# Patient Record
Sex: Male | Born: 1980 | Race: White | Hispanic: No | Marital: Single | Smoking: Current every day smoker
Health system: Southern US, Community
[De-identification: ages and names within clinical notes are randomized; demographics above are authoritative.]

## PROBLEM LIST (undated history)

## (undated) DIAGNOSIS — F32A Depression, unspecified: Secondary | ICD-10-CM

## (undated) DIAGNOSIS — F329 Major depressive disorder, single episode, unspecified: Secondary | ICD-10-CM

---
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
## 2019-05-09 ENCOUNTER — Other Ambulatory Visit: Payer: Self-pay

## 2019-05-09 ENCOUNTER — Encounter: Payer: Self-pay | Admitting: Emergency Medicine

## 2019-05-09 ENCOUNTER — Emergency Department
Admission: EM | Admit: 2019-05-09 | Discharge: 2019-05-09 | Disposition: A | Discharge status: Home / Self Care | Payer: Self-pay | Attending: Emergency Medicine | Admitting: Emergency Medicine

## 2019-05-09 DIAGNOSIS — R079 Chest pain, unspecified: Secondary | ICD-10-CM | POA: Insufficient documentation

## 2019-05-09 DIAGNOSIS — Z5321 Procedure and treatment not carried out due to patient leaving prior to being seen by health care provider: Secondary | ICD-10-CM | POA: Insufficient documentation

## 2019-05-09 HISTORY — DX: Depression, unspecified: F32.A

## 2019-05-09 LAB — CBC
HCT: 42.1 % (ref 39.0–52.0)
Hemoglobin: 14.8 g/dL (ref 13.0–17.0)
MCH: 34.1 pg — ABNORMAL HIGH (ref 26.0–34.0)
MCHC: 35.2 g/dL (ref 30.0–36.0)
MCV: 97 fL (ref 80.0–100.0)
Platelets: 169 10*3/uL (ref 150–400)
RBC: 4.34 MIL/uL (ref 4.22–5.81)
RDW: 12.5 % (ref 11.5–15.5)
WBC: 6.3 10*3/uL (ref 4.0–10.5)
nRBC: 0 % (ref 0.0–0.2)

## 2019-05-09 LAB — BASIC METABOLIC PANEL
Anion gap: 9 (ref 5–15)
BUN: 10 mg/dL (ref 6–20)
CO2: 19 mmol/L — ABNORMAL LOW (ref 22–32)
Calcium: 8.6 mg/dL — ABNORMAL LOW (ref 8.9–10.3)
Chloride: 113 mmol/L — ABNORMAL HIGH (ref 98–111)
Creatinine, Ser: 0.83 mg/dL (ref 0.61–1.24)
GFR calc Af Amer: >60 mL/min (ref >60)
GFR calc non Af Amer: >60 mL/min (ref >60)
Glucose, Bld: 111 mg/dL — ABNORMAL HIGH (ref 70–99)
Potassium: 3.7 mmol/L (ref 3.5–5.1)
Sodium: 141 mmol/L (ref 135–145)

## 2019-05-09 LAB — ETHANOL: Alcohol, Ethyl (B): 253 mg/dL — ABNORMAL HIGH (ref <10)

## 2019-05-09 LAB — URINE DRUG SCREEN, QUALITATIVE (ARMC ONLY)
Amphetamines, Ur Screen: NOT DETECTED
Barbiturates, Ur Screen: NOT DETECTED
Benzodiazepine, Ur Scrn: NOT DETECTED
Cannabinoid 50 Ng, Ur ~~LOC~~: NOT DETECTED
Cocaine Metabolite,Ur ~~LOC~~: POSITIVE — AB
MDMA (Ecstasy)Ur Screen: NOT DETECTED
Methadone Scn, Ur: NOT DETECTED
Opiate, Ur Screen: NOT DETECTED
Phencyclidine (PCP) Ur S: NOT DETECTED
Tricyclic, Ur Screen: NOT DETECTED

## 2019-05-09 LAB — TROPONIN I: Troponin I: <0.03 ng/mL (ref <0.03)

## 2019-05-09 LAB — LIPASE, BLOOD: Lipase: 24 U/L (ref 11–51)

## 2019-05-09 NOTE — ED Notes (Signed)
Pt not in subwait, Danelle Earthly, rn states he removed pt's iv because pt states he wanted to leave.

## 2019-05-09 NOTE — ED Notes (Signed)
Per beth, ed tech, pt has left.

## 2019-05-09 NOTE — ED Notes (Addendum)
Pt requesting to leave and "go back to the [facility]", Charge (Lea) notified, IV removed as requested, pt verbally aggressive after being told the EDP had to assess the pt for requested "work note", [employer] notified d/t language and pt slapping walls as leaving: "fuck y'all, fuck all y'all"  Pt ambulatory to DC  April, first RN notified

## 2019-05-09 NOTE — ED Triage Notes (Signed)
Patient brought in by ems with complaint of central chest pain radiating to his back that started about 30 minutes ago. Patient states that he has some shortness of breath. Denies nausea and vomiting.

## 2019-05-09 NOTE — ED Notes (Signed)
This rn did not visualize pt leave department. Per officer in front pt left by side door, ambulatory from department.

## 2019-09-03 ENCOUNTER — Emergency Department
Admission: EM | Admit: 2019-09-03 | Discharge: 2019-09-03 | Disposition: A | Discharge status: Home / Self Care | Payer: Medicaid Other | Attending: Emergency Medicine | Admitting: Emergency Medicine

## 2019-09-03 ENCOUNTER — Other Ambulatory Visit: Payer: Self-pay

## 2019-09-03 DIAGNOSIS — F10188 Alcohol abuse with other alcohol-induced disorder: Secondary | ICD-10-CM | POA: Insufficient documentation

## 2019-09-03 DIAGNOSIS — F1019 Alcohol abuse with unspecified alcohol-induced disorder: Secondary | ICD-10-CM | POA: Diagnosis present

## 2019-09-03 MED ORDER — GABAPENTIN 300 MG PO CAPS: 300.0000 mg | ORAL_CAPSULE | Freq: Three times a day (TID) | ORAL | 0 refills | Status: DC

## 2019-09-03 MED ORDER — GABAPENTIN 300 MG PO CAPS
300.0000 mg | ORAL_CAPSULE | Freq: Three times a day (TID) | ORAL | Status: DC
  Administered 2019-09-03: 300 mg via ORAL
  Filled 2019-09-03: qty 1

## 2019-09-03 NOTE — BH Assessment (Signed)
Pt presents to ED requesting detox treatment. Pt provided with [facility] outpatient resources.

## 2019-09-03 NOTE — Discharge Instructions (Signed)
[facility] shelter Address: [address], [place], [place] [address] Hours:  Open ? Closes 11:59PM Phone: [phone]  [facility] & [facility]  Mental health service in [place], [place] Address: [address], [place],  [address] Hours:  Closed ? Caesar Bookman Phone: [phone]

## 2019-09-03 NOTE — ED Notes (Signed)
Patient is calm and cooperative, Patient denies Si/hi or avh at this time, will continue to monitor.

## 2019-09-03 NOTE — ED Provider Notes (Signed)
[facility] Emergency Department Provider Note  ____________________________________________   First MD Initiated Contact with Patient 09/03/19 1538     (approximate)  I have reviewed the triage vital signs and the nursing notes.   HISTORY  Chief Complaint Alcohol Problem    HPI Aaron Jensen is a 38 y.o. male with past medical history depression and chronic alcohol abuse here desiring help.  The patient was recently in a rehab.  He was 4 months clean.  He was kicked out of the rehab a week ago, and was staying at a hotel.  Since taking hotel, he endorses cocaine as well as alcohol use.  His last drink was approximately 50 minutes ago.  He states he no longer wants to drink and is here seeking help.  He requests inpatient detox.  Denies any actual suicidal or homicidal ideation.  He is not hallucinating.  Denies any history of complicated withdrawal.  He is amenable to outpatient follow-up if it is able to be arranged.        Past Medical History:  Diagnosis Date  . Depression     Patient Active Problem List   Diagnosis Date Noted  . Alcohol abuse with alcohol-induced disorder ([place]) 09/03/2019    No past surgical history on file.  Prior to Admission medications   Medication Sig Start Date End Date Taking? Authorizing Provider  gabapentin (NEURONTIN) 300 MG capsule Take 1 capsule (300 mg total) by mouth 3 (three) times daily. 09/03/19   Patrecia Pour, NP    Allergies Patient has no known allergies.  No family history on file.  Social History Social History   Tobacco Use  . Smoking status: Never Smoker  . Smokeless tobacco: Never Used  Substance Use Topics  . Alcohol use: Yes  . Drug use: Never    Review of Systems  Review of Systems  Constitutional: Negative for chills, fatigue and fever.  HENT: Negative for sore throat.   Respiratory: Negative for shortness of breath.   Cardiovascular: Negative for chest pain.  Gastrointestinal:  Negative for abdominal pain.  Genitourinary: Negative for flank pain.  Musculoskeletal: Negative for neck pain.  Skin: Negative for rash and wound.  Allergic/Immunologic: Negative for immunocompromised state.  Neurological: Negative for weakness and numbness.  Hematological: Does not bruise/bleed easily.  Psychiatric/Behavioral: Positive for behavioral problems.     ____________________________________________  PHYSICAL EXAM:      VITAL SIGNS: ED Triage Vitals  Enc Vitals Group     BP 09/03/19 1429 (!) 131/91     Pulse Rate 09/03/19 1429 (!) 111     Resp 09/03/19 1429 18     Temp 09/03/19 1429 98.6 F (37 C)     Temp Source 09/03/19 1429 Oral     SpO2 09/03/19 1429 95 %     Weight 09/03/19 1434 175 lb (79.4 kg)     Height --      Head Circumference --      Peak Flow --      Pain Score 09/03/19 1434 0     Pain Loc --      Pain Edu? --      Excl. in [place]? --      Physical Exam Vitals signs and nursing note reviewed.  Constitutional:      General: He is not in acute distress.    Appearance: He is well-developed.  HENT:     Head: Normocephalic and atraumatic.  Eyes:     Conjunctiva/sclera: Conjunctivae normal.  Neck:     Musculoskeletal: Neck supple.  Cardiovascular:     Rate and Rhythm: Normal rate and regular rhythm.     Heart sounds: Normal heart sounds. No murmur. No friction rub.  Pulmonary:     Effort: Pulmonary effort is normal. No respiratory distress.     Breath sounds: Normal breath sounds. No wheezing or rales.  Abdominal:     General: There is no distension.     Palpations: Abdomen is soft.     Tenderness: There is no abdominal tenderness.  Skin:    General: Skin is warm.     Capillary Refill: Capillary refill takes less than 2 seconds.  Neurological:     Mental Status: He is alert and oriented to person, place, and time.     Motor: No abnormal muscle tone.       ____________________________________________   LABS (all labs ordered are  listed, but only abnormal results are displayed)  Labs Reviewed - No data to display  ____________________________________________  EKG: None ________________________________________  RADIOLOGY All imaging, including plain films, CT scans, and ultrasounds, independently reviewed by me, and interpretations confirmed via formal radiology reads.  ED MD interpretation:   None  Official radiology report(s): No results found.  ____________________________________________  PROCEDURES   Procedure(s) performed (including Critical Care):  Procedures  ____________________________________________  INITIAL IMPRESSION / MDM / ASSESSMENT AND PLAN / ED COURSE  As part of my medical decision making, I reviewed the following data within the electronic MEDICAL RECORD NUMBER Notes from prior ED visits and [place] Controlled Substance Database      *Aaron Jensen was evaluated in Emergency Department on 09/03/2019 for the symptoms described in the history of present illness. He was evaluated in the context of the global COVID-19 pandemic, which necessitated consideration that the patient might be at risk for infection with the SARS-CoV-2 virus that causes COVID-19. Institutional protocols and algorithms that pertain to the evaluation of patients at risk for COVID-19 are in a state of rapid change based on information released by regulatory bodies including the CDC and federal and state organizations. These policies and algorithms were followed during the patient's care in the ED.  Some ED evaluations and interventions may be delayed as a result of limited staffing during the pandemic.*      Medical Decision Making: 38 year old male here with desire for detox.  Discussed that we are unable to provide inpatient detox, but he was provided with resources by TTS and psychiatry services.  They do not feel he needs urgent or emergent intervention or admission.  The patient admits to alcohol use but is clinically  sober and ambulatory without difficulty.  His tachycardia is resolved after he is able to talk, I suspect this was anxiety related.  He is not homicidal or suicidal.  Will discharge with close outpatient follow-up.  ____________________________________________  FINAL CLINICAL IMPRESSION(S) / ED DIAGNOSES  Final diagnoses:  Alcohol abuse with alcohol-induced disorder ([facility])     MEDICATIONS GIVEN DURING THIS VISIT:  Medications  gabapentin (NEURONTIN) capsule 300 mg (has no administration in time range)     ED Discharge Orders         Ordered    gabapentin (NEURONTIN) 300 MG capsule  3 times daily     09/03/19 1615    Increase activity slowly     09/03/19 1615    Diet - low sodium heart healthy     09/03/19 1615    Discharge instructions    Comments: Follow  up with [facility]   09/03/19 1615           Note:  This document was prepared using Dragon voice recognition software and may include unintentional dictation errors.   Shaune PollackIsaacs, Sinai Mahany, MD 09/03/19 [phone]
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
## 2019-09-03 NOTE — ED Notes (Signed)
Patient voiced understanding of discharge instructions, and all belongings with him, no signs of distress, he stated that He wanted to leave because He knows that He just messed up drinking and He was going to have to fix this problem and He knew the hospital could not help him with His addiction, Patient left and also states He was going to try to go back to the homeless shelter.

## 2019-09-03 NOTE — ED Triage Notes (Signed)
Pt presents via POV requesting detox from alcohol. Last drink x15 mins PTA. No active tremors. Denies seizure history.,

## 2019-12-05 ENCOUNTER — Other Ambulatory Visit: Payer: Self-pay

## 2019-12-05 ENCOUNTER — Emergency Department
Admission: EM | Admit: 2019-12-05 | Discharge: 2019-12-05 | Disposition: A | Discharge status: Home / Self Care | Payer: Self-pay | Attending: Emergency Medicine | Admitting: Emergency Medicine

## 2019-12-05 ENCOUNTER — Emergency Department: Payer: Self-pay

## 2019-12-05 ENCOUNTER — Encounter: Payer: Self-pay | Admitting: Emergency Medicine

## 2019-12-05 DIAGNOSIS — F17210 Nicotine dependence, cigarettes, uncomplicated: Secondary | ICD-10-CM | POA: Insufficient documentation

## 2019-12-05 DIAGNOSIS — J029 Acute pharyngitis, unspecified: Secondary | ICD-10-CM

## 2019-12-05 LAB — COMPREHENSIVE METABOLIC PANEL
ALT: 24 U/L (ref 0–44)
AST: 34 U/L (ref 15–41)
Albumin: 3.5 g/dL (ref 3.5–5.0)
Alkaline Phosphatase: 72 U/L (ref 38–126)
Anion gap: 9 (ref 5–15)
BUN: 9 mg/dL (ref 6–20)
CO2: 25 mmol/L (ref 22–32)
Calcium: 7.9 mg/dL — ABNORMAL LOW (ref 8.9–10.3)
Chloride: 104 mmol/L (ref 98–111)
Creatinine, Ser: 0.88 mg/dL (ref 0.61–1.24)
GFR calc Af Amer: >60 mL/min (ref >60)
GFR calc non Af Amer: >60 mL/min (ref >60)
Glucose, Bld: 100 mg/dL — ABNORMAL HIGH (ref 70–99)
Potassium: 3.4 mmol/L — ABNORMAL LOW (ref 3.5–5.1)
Sodium: 138 mmol/L (ref 135–145)
Total Bilirubin: 0.6 mg/dL (ref 0.3–1.2)
Total Protein: 6.4 g/dL — ABNORMAL LOW (ref 6.5–8.1)

## 2019-12-05 LAB — CBC WITH DIFFERENTIAL/PLATELET
Abs Immature Granulocytes: 0.02 10*3/uL (ref 0.00–0.07)
Basophils Absolute: 0.1 10*3/uL (ref 0.0–0.1)
Basophils Relative: 1 %
Eosinophils Absolute: 0.3 10*3/uL (ref 0.0–0.5)
Eosinophils Relative: 8 %
HCT: 41.9 % (ref 39.0–52.0)
Hemoglobin: 14.8 g/dL (ref 13.0–17.0)
Immature Granulocytes: 1 %
Lymphocytes Relative: 40 %
Lymphs Abs: 1.8 10*3/uL (ref 0.7–4.0)
MCH: 33.3 pg (ref 26.0–34.0)
MCHC: 35.3 g/dL (ref 30.0–36.0)
MCV: 94.2 fL (ref 80.0–100.0)
Monocytes Absolute: 0.4 10*3/uL (ref 0.1–1.0)
Monocytes Relative: 10 %
Neutro Abs: 1.8 10*3/uL (ref 1.7–7.7)
Neutrophils Relative %: 40 %
Platelets: 131 10*3/uL — ABNORMAL LOW (ref 150–400)
RBC: 4.45 MIL/uL (ref 4.22–5.81)
RDW: 12.4 % (ref 11.5–15.5)
WBC: 4.4 10*3/uL (ref 4.0–10.5)
nRBC: 0 % (ref 0.0–0.2)

## 2019-12-05 LAB — GROUP A STREP BY PCR: Group A Strep by PCR: NOT DETECTED

## 2019-12-05 NOTE — ED Provider Notes (Signed)
[facility] Emergency Department Provider Note   ____________________________________________   First MD Initiated Contact with Patient 12/05/19 [phone]     (approximate)  I have reviewed the triage vital signs and the nursing notes.   HISTORY  Chief Complaint Sore Throat  HPI Aaron Jensen is a 38 y.o. male presents to the ED this morning with complaint of sudden onset of sore throat.  Patient states that it woke him from his sleep and he is not sure whether he was sleeping with his mouth open.  He states it was difficult to swallow however he did drink water and iced tea before calling EMS.  Patient states that EMS reassured him that his breathing was okay and he came by private vehicle.  Patient initially went to work and was told to come to the ED for evaluation.  He denies any fever, chills, nausea or vomiting.  There is been no respiratory symptoms.  He denies any exposure to Covid however he does live in a boardinghouse.  He states that he mostly stays in his room but does have a kitchen that is shared by multiple people.  He rates his pain as 6 out of 10.       Past Medical History:  Diagnosis Date  . Depression     Patient Active Problem List   Diagnosis Date Noted  . 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 No pertinent surgical history.  Prior to Admission medications   Medication Sig Start Date End Date Taking? Authorizing Provider  gabapentin (NEURONTIN) 300 MG capsule Take 1 capsule (300 mg total) by mouth 3 (three) times daily. 09/03/19   Patrecia Pour, NP    Allergies Patient has no known allergies.  No family history on file.  Social History Social History   Tobacco Use  . Smoking status: Current Every Day Smoker  . Smokeless tobacco: Never Used  Substance Use Topics  . Alcohol use: Yes    Comment: occ  . Drug use: Never    Review of Systems Constitutional: No fever/chills Eyes:  No visual changes. ENT: Positive sore throat. Cardiovascular: Denies chest pain. Respiratory: Denies shortness of breath. Gastrointestinal: No abdominal pain.  No nausea, no vomiting.  No diarrhea. Musculoskeletal: Negative for back pain. Skin: Negative for rash. Neurological: Negative for headaches, focal weakness or numbness. ___________________________________________   PHYSICAL EXAM:  VITAL SIGNS: ED Triage Vitals  Enc Vitals Group     BP 12/05/19 0656 116/87     Pulse Rate 12/05/19 0656 82     Resp 12/05/19 0656 18     Temp 12/05/19 0656 (!) 97.5 F (36.4 C)     Temp Source 12/05/19 0656 Oral     SpO2 12/05/19 0656 98 %     Weight 12/05/19 0657 165 lb (74.8 kg)     Height 12/05/19 0657 6' (1.829 m)     Head Circumference --      Peak Flow --      Pain Score 12/05/19 0657 6     Pain Loc --      Pain Edu? --      Excl. in [place]? --     Constitutional: Alert and oriented. Well appearing and in no acute distress. Eyes: Conjunctivae are normal. PERRL. EOMI. Head: Atraumatic. Nose: No congestion/rhinnorhea. Mouth/Throat: Mucous membranes are moist.  Oropharynx mild erythema with uvula  midline and no tonsillar exudate is noted.  No tonsillar enlargement.  Posterior drainage is  also noted. Neck: No stridor.   Hematological/Lymphatic/Immunilogical: No cervical lymphadenopathy. Cardiovascular: Normal rate, regular rhythm. Grossly normal heart sounds.  Good peripheral circulation. Respiratory: Normal respiratory effort.  No retractions. Lungs CTAB. Musculoskeletal: No lower extremity tenderness nor edema.  No joint effusions. Neurologic:  Normal speech and language. No gross focal neurologic deficits are appreciated. No gait instability. Skin:  Skin is warm, dry and intact. No rash noted. Psychiatric: Mood and affect are normal. Speech and behavior are normal.  ____________________________________________   LABS (all labs ordered are listed, but only abnormal results are  displayed)  Labs Reviewed  CBC WITH DIFFERENTIAL/PLATELET - Abnormal; Notable for the following components:      Result Value   Platelets 131 (*)    All other components within normal limits  COMPREHENSIVE METABOLIC PANEL - Abnormal; Notable for the following components:   Potassium 3.4 (*)    Glucose, Bld 100 (*)    Calcium 7.9 (*)    Total Protein 6.4 (*)    All other components within normal limits  GROUP A STREP BY PCR    RADIOLOGY   Official radiology report(s): DG Chest Portable 1 View  Result Date: 12/05/2019 CLINICAL DATA:  Sore throat and shortness of breath starting today. EXAM: PORTABLE CHEST 1 VIEW COMPARISON:  None FINDINGS: Heart size within normal limits. There is no airspace consolidation within the lungs. No evidence of pleural effusion or pneumothorax. No acute bony abnormality. IMPRESSION: IMPRESSION No evidence of acute cardiopulmonary abnormality. Electronically Signed   By: Jackey Loge DO   On: 12/05/2019 08:26    ____________________________________________   PROCEDURES  Procedure(s) performed (including Critical Care):  Procedures   ____________________________________________   INITIAL IMPRESSION / ASSESSMENT AND PLAN / ED COURSE  As part of my medical decision making, I reviewed the following data within the electronic MEDICAL RECORD NUMBER Notes from prior ED visits and [place] Controlled Substance Database  [name] was evaluated in Emergency Department on 12/05/2019 for the symptoms described in the history of present illness. He was evaluated in the context of the global COVID-19 pandemic, which necessitated consideration that the patient might be at risk for infection with the SARS-CoV-2 virus that causes COVID-19. Institutional protocols and algorithms that pertain to the evaluation of patients at risk for COVID-19 are in a state of rapid change based on information released by regulatory bodies including the CDC and federal and state  organizations. These policies and algorithms were followed during the patient's care in the ED.  38 year old male presents to the ED with complaint of sore throat that woke him up during the night.  He states he feels that his tonsils are swollen and that he is not able to breathe.  He denies any fever or chills.  He lives in a boardinghouse and states that mostly he stays in his room but everyone shares the same kitchen.  It is possible that he has been exposed to Covid and does not know it.  Chest x-ray was negative for acute changes.  Lab work was reassuring.  Patient is only minimal hypokalemic and was encouraged to eat more foods containing potassium.  Strep test was negative.  Patient was given a note to remain out of work today.  He is also encouraged to go for a free Covid test through the health department should he develop more symptoms or become febrile.     ____________________________________________   FINAL CLINICAL IMPRESSION(S) / ED DIAGNOSES  Final diagnoses:  Acute pharyngitis, unspecified etiology  ED Discharge Orders    None       Note:  This document was prepared using Dragon voice recognition software and may include unintentional dictation errors.    Tommi RumpsSummers, Gardiner Espana L, PA-C 12/05/19 1105    Arnaldo NatalMalinda, Paul F, MD 12/05/19 [phone]

## 2019-12-05 NOTE — Discharge Instructions (Addendum)
Follow-up with Dr. Farrel Conners if any continued problems with your throat.  Strep test today was negative also your lab work was unremarkable and your chest x-ray was negative.  Decrease the amount of smoking that you are doing.  Also continue wearing a mask especially if you are in a boardinghouse situation where you may come in contact with someone who is positive for Covid.  If you develop fever and more symptoms she may want to consider getting a Covid test at the free clinic that is a [facility] here at the hospital.  Use salt water to gargle with for your throat pain.  Take Tylenol or ibuprofen as needed for throat pain.  Increase foods with potassium.  Follow-up with your primary care provider if any continued problems.

## 2019-12-05 NOTE — ED Triage Notes (Signed)
Patient states that he woke up with a sore throat and feels like his tonsils are swollen. Patient states that he drank some water and it started to feel better but then he felt like the swelling came back.

## 2019-12-05 NOTE — ED Notes (Signed)
Se triage note  Presents with sore throat this am  States feels like thorat is swollen  Afebrile on arrival

## 2020-04-13 ENCOUNTER — Encounter: Payer: Self-pay | Admitting: Emergency Medicine

## 2020-04-13 ENCOUNTER — Emergency Department
Admission: EM | Admit: 2020-04-13 | Discharge: 2020-04-13 | Disposition: A | Discharge status: Home / Self Care | Payer: Medicaid Other | Attending: Emergency Medicine | Admitting: Emergency Medicine

## 2020-04-13 ENCOUNTER — Other Ambulatory Visit: Payer: Self-pay

## 2020-04-13 DIAGNOSIS — F17210 Nicotine dependence, cigarettes, uncomplicated: Secondary | ICD-10-CM | POA: Insufficient documentation

## 2020-04-13 DIAGNOSIS — L235 Allergic contact dermatitis due to other chemical products: Secondary | ICD-10-CM | POA: Insufficient documentation

## 2020-04-13 DIAGNOSIS — L308 Other specified dermatitis: Secondary | ICD-10-CM | POA: Insufficient documentation

## 2020-04-13 MED ORDER — METHYLPREDNISOLONE 4 MG PO TBPK: ORAL_TABLET | ORAL | 0 refills | Status: DC

## 2020-04-13 MED ORDER — HYDROCORTISONE VALERATE 0.2 % EX OINT: TOPICAL_OINTMENT | CUTANEOUS | 1 refills | Status: DC

## 2020-04-13 MED ORDER — HYDROXYZINE HCL 50 MG PO TABS: 50.0000 mg | ORAL_TABLET | Freq: Three times a day (TID) | ORAL | 0 refills | Status: DC | PRN

## 2020-04-13 NOTE — Discharge Instructions (Signed)
Follow discharge care instruction and try to avoid irritants.  Take medication as directed.

## 2020-04-13 NOTE — ED Notes (Signed)
Pt denies fevers/chills. 

## 2020-04-13 NOTE — ED Triage Notes (Signed)
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 Pt with redness noted to both forearms and hands. Pt reports they itch and burn.

## 2020-04-13 NOTE — ED Provider Notes (Signed)
[facility] Emergency Department Provider Note   ____________________________________________   First MD Initiated Contact with Patient 04/13/20 1221     (approximate)  I have reviewed the triage vital signs and the nursing notes.   HISTORY  Chief Complaint Rash    HPI Aaron Jensen is a 39 y.o. male patient complaining of a rash with itching to the upper extremities.  Patient the rash starts at the hand spreads up the forearm.  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  Patient state he wears gloves but that did not come up fully on the forearms. . Patient states gloves become full of water when he emerges deeply into the sinks.  Patient stays in the process of obtaining clothes of a longer length.  Patient rates his pain discomfort as 8/10.  Patient describes his pain/discomfort as "itching".  No palliative measure for complaint.      Past Medical History:  Diagnosis Date  . Depression     Patient Active Problem List   Diagnosis Date Noted  . Alcohol abuse with alcohol-induced disorder (HCC) 09/03/2019    History reviewed. No pertinent surgical history.  Prior to Admission medications   Medication Sig Start Date End Date Taking? Authorizing Provider  gabapentin (NEURONTIN) 300 MG capsule Take 1 capsule (300 mg total) by mouth 3 (three) times daily. 09/03/19   Charm Rings, NP  hydrocortisone valerate ointment (WESTCORT) 0.2 % Apply to affected area daily 04/13/20 04/13/21  Joni Reining, PA-C  hydrOXYzine (ATARAX/VISTARIL) 50 MG tablet Take 1 tablet (50 mg total) by mouth 3 (three) times daily as needed for itching. 04/13/20   Joni Reining, PA-C  methylPREDNISolone (MEDROL DOSEPAK) 4 MG TBPK tablet Take Tapered dose as directed 04/13/20   Joni Reining, PA-C    Allergies Patient has no known allergies.  No family history on file.  Social History Social History   Tobacco Use  . Smoking status: Current  Every Day Smoker  . Smokeless tobacco: Never Used  Substance Use Topics  . Alcohol use: Yes    Comment: occ  . Drug use: Never    Review of Systems  Constitutional: No fever/chills Eyes: No visual changes. ENT: No sore throat. Cardiovascular: Denies chest pain. Respiratory: Denies shortness of breath. Gastrointestinal: No abdominal pain.  No nausea, no vomiting.  No diarrhea.  No constipation. Genitourinary: Negative for dysuria. Musculoskeletal: Negative for back pain. Skin: Negative for rash. Neurological: Negative for headaches, focal weakness or numbness. Psychiatric:  Depression ____________________________________________   PHYSICAL EXAM:  VITAL SIGNS: ED Triage Vitals  Enc Vitals Group     BP 04/13/20 1146 (!) 127/92     Pulse Rate 04/13/20 1146 91     Resp --      Temp 04/13/20 1146 97.9 F (36.6 C)     Temp Source 04/13/20 1146 Oral     SpO2 04/13/20 1146 99 %     Weight 04/13/20 1142 165 lb (74.8 kg)     Height 04/13/20 1142 6' (1.829 m)     Head Circumference --      Peak Flow --      Pain Score 04/13/20 1142 8     Pain Loc --      Pain Edu? --      Excl. in GC? --    Constitutional: Alert and oriented. Well appearing and in no acute distress. Cardiovascular: Normal rate, regular rhythm. Grossly normal heart sounds.  Good peripheral circulation.  Respiratory: Normal respiratory effort.  No retractions. Lungs CTAB. Skin:  Skin is warm, dry and intact.  Vesicular lesions on the hand.  Signs of excoriation bilateral forearms. Psychiatric: Mood and affect are normal. Speech and behavior are normal.  ____________________________________________   LABS (all labs ordered are listed, but only abnormal results are displayed)  Labs Reviewed - No data to display ____________________________________________  EKG   ____________________________________________  RADIOLOGY  ED MD interpretation:    Official radiology report(s): No results  found.  ____________________________________________   PROCEDURES  Procedure(s) performed (including Critical Care):  Procedures   ____________________________________________   INITIAL IMPRESSION / ASSESSMENT AND PLAN / ED COURSE  As part of my medical decision making, I reviewed the following data within the electronic MEDICAL RECORD NUMBER     Contact dermatitis and eczema to bilateral upper extremities.  Patient given discharge care instructions.  Patient advised to obtain full-length gloves covering the upper extremities.  Take medication as directed.  Follow-up with dermatology if no improvement in 2 weeks.    [name] was evaluated in Emergency Department on 04/13/2020 for the symptoms described in the history of present illness. He was evaluated in the context of the global COVID-19 pandemic, which necessitated consideration that the patient might be at risk for infection with the SARS-CoV-2 virus that causes COVID-19. Institutional protocols and algorithms that pertain to the evaluation of patients at risk for COVID-19 are in a state of rapid change based on information released by regulatory bodies including the CDC and federal and state organizations. These policies and algorithms were followed during the patient's care in the ED.       ____________________________________________   FINAL CLINICAL IMPRESSION(S) / ED DIAGNOSES  Final diagnoses:  Allergic dermatitis due to other chemical product  Other eczema     ED Discharge Orders         Ordered    hydrocortisone valerate ointment (WESTCORT) 0.2 %     04/13/20 1228    methylPREDNISolone (MEDROL DOSEPAK) 4 MG TBPK tablet     04/13/20 1228    hydrOXYzine (ATARAX/VISTARIL) 50 MG tablet  3 times daily PRN     04/13/20 1228           Note:  This document was prepared using Dragon voice recognition software and may include unintentional dictation errors.    Sable Feil, PA-C 04/13/20 1235     Blake Divine, MD 04/14/20 0800

## 2020-08-23 ENCOUNTER — Emergency Department
Admission: EM | Admit: 2020-08-23 | Discharge: 2020-08-23 | Disposition: A | Discharge status: Home / Self Care | Payer: Medicaid Other | Attending: Emergency Medicine | Admitting: Emergency Medicine

## 2020-08-23 ENCOUNTER — Other Ambulatory Visit: Payer: Self-pay

## 2020-08-23 ENCOUNTER — Encounter: Payer: Self-pay | Admitting: Emergency Medicine

## 2020-08-23 DIAGNOSIS — Y939 Activity, unspecified: Secondary | ICD-10-CM | POA: Insufficient documentation

## 2020-08-23 DIAGNOSIS — Y929 Unspecified place or not applicable: Secondary | ICD-10-CM | POA: Insufficient documentation

## 2020-08-23 DIAGNOSIS — Y999 Unspecified external cause status: Secondary | ICD-10-CM | POA: Insufficient documentation

## 2020-08-23 DIAGNOSIS — X19XXXA Contact with other heat and hot substances, initial encounter: Secondary | ICD-10-CM | POA: Insufficient documentation

## 2020-08-23 DIAGNOSIS — T300 Burn of unspecified body region, unspecified degree: Secondary | ICD-10-CM

## 2020-08-23 DIAGNOSIS — T24332A Burn of third degree of left lower leg, initial encounter: Secondary | ICD-10-CM | POA: Insufficient documentation

## 2020-08-23 DIAGNOSIS — F17200 Nicotine dependence, unspecified, uncomplicated: Secondary | ICD-10-CM | POA: Insufficient documentation
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
## 2020-09-26 ENCOUNTER — Encounter: Payer: Self-pay | Admitting: Emergency Medicine

## 2020-09-26 ENCOUNTER — Emergency Department: Payer: Medicaid Other

## 2020-09-26 ENCOUNTER — Other Ambulatory Visit: Payer: Self-pay

## 2020-09-26 ENCOUNTER — Emergency Department
Admission: EM | Admit: 2020-09-26 | Discharge: 2020-09-26 | Disposition: A | Discharge status: Home / Self Care | Payer: Medicaid Other | Attending: Emergency Medicine | Admitting: Emergency Medicine

## 2020-09-26 DIAGNOSIS — F1021 Alcohol dependence, in remission: Secondary | ICD-10-CM | POA: Insufficient documentation

## 2020-09-26 DIAGNOSIS — F17200 Nicotine dependence, unspecified, uncomplicated: Secondary | ICD-10-CM | POA: Insufficient documentation

## 2020-09-26 DIAGNOSIS — G454 Transient global amnesia: Secondary | ICD-10-CM | POA: Insufficient documentation

## 2020-09-26 DIAGNOSIS — R413 Other amnesia: Secondary | ICD-10-CM

## 2020-09-26 LAB — URINALYSIS, COMPLETE (UACMP) WITH MICROSCOPIC
Bacteria, UA: NONE SEEN
Bilirubin Urine: NEGATIVE
Glucose, UA: NEGATIVE mg/dL
Hgb urine dipstick: NEGATIVE
Ketones, ur: NEGATIVE mg/dL
Leukocytes,Ua: NEGATIVE
Nitrite: NEGATIVE
Protein, ur: NEGATIVE mg/dL
Specific Gravity, Urine: 1.017 (ref 1.005–1.030)
Squamous Epithelial / HPF: NONE SEEN (ref 0–5)
pH: 5 (ref 5.0–8.0)

## 2020-09-26 LAB — CBC
HCT: 43.1 % (ref 39.0–52.0)
Hemoglobin: 14.9 g/dL (ref 13.0–17.0)
MCH: 33.4 pg (ref 26.0–34.0)
MCHC: 34.6 g/dL (ref 30.0–36.0)
MCV: 96.6 fL (ref 80.0–100.0)
Platelets: 160 10*3/uL (ref 150–400)
RBC: 4.46 MIL/uL (ref 4.22–5.81)
RDW: 12.3 % (ref 11.5–15.5)
WBC: 7.3 10*3/uL (ref 4.0–10.5)
nRBC: 0 % (ref 0.0–0.2)

## 2020-09-26 LAB — BASIC METABOLIC PANEL
Anion gap: 8 (ref 5–15)
BUN: 19 mg/dL (ref 6–20)
CO2: 25 mmol/L (ref 22–32)
Calcium: 8.9 mg/dL (ref 8.9–10.3)
Chloride: 103 mmol/L (ref 98–111)
Creatinine, Ser: 1.12 mg/dL (ref 0.61–1.24)
GFR calc non Af Amer: >60 mL/min (ref >60)
Glucose, Bld: 94 mg/dL (ref 70–99)
Potassium: 3.4 mmol/L — ABNORMAL LOW (ref 3.5–5.1)
Sodium: 136 mmol/L (ref 135–145)

## 2020-09-26 MED ORDER — THIAMINE HCL 100 MG PO TABS: 100.0000 mg | ORAL_TABLET | Freq: Every day | ORAL | 0 refills | Status: DC

## 2020-09-26 NOTE — ED Provider Notes (Signed)
[facility] Emergency Department Provider Note  ____________________________________________   First MD Initiated Contact with Patient 09/26/20 1329     (approximate)  I have reviewed the triage vital signs and the nursing notes.   HISTORY  Chief Complaint Loss of Consciousness    HPI Aaron Jensen is a 39 y.o. male surgical history depression, chronic alcohol abuse, here with episodes in which he "blacked out."  He states that over the last several weeks to months, he has noticed that he has been having episodes in which he feels like he loses track of certain periods of time.  He states that while at work, he has had multiple episodes where he has "blacked out" for 10 to 15 minutes at a time.  He is not actually unconscious during these episodes and has been told that he continues to work, discussed with coworkers, and has had no witnessed seizure-like activity.  However, he feels like he does not remember what happened during these episodes, and he has behaved oddly, for example sitting in a puddle at work without meaning to.  Denies any preceding or subsequent headaches, vision changes, numbness, or weakness.  No history of seizures.  His last alcohol use was 6 to 9 months ago, and he denies any history of seizures during his detox.  No other acute complaints.  No fevers or chills.  No recent medication changes.  No other drug use.        Past Medical History:  Diagnosis Date  . Depression     Patient Active Problem List   Diagnosis Date Noted  . Alcohol abuse with alcohol-induced disorder (HCC) 09/03/2019    History reviewed. No pertinent surgical history.  Prior to Admission medications   Medication Sig Start Date End Date Taking? Authorizing Provider  gabapentin (NEURONTIN) 300 MG capsule Take 1 capsule (300 mg total) by mouth 3 (three) times daily. 09/03/19   Charm Rings, NP  hydrocortisone valerate ointment (WESTCORT) 0.2 % Apply to affected  area daily 04/13/20 04/13/21  Joni Reining, PA-C  hydrOXYzine (ATARAX/VISTARIL) 50 MG tablet Take 1 tablet (50 mg total) by mouth 3 (three) times daily as needed for itching. 04/13/20   Joni Reining, PA-C  methylPREDNISolone (MEDROL DOSEPAK) 4 MG TBPK tablet Take Tapered dose as directed 04/13/20   Joni Reining, PA-C  thiamine 100 MG tablet Take 1 tablet (100 mg total) by mouth daily. 09/26/20 10/26/20  Shaune Pollack, MD    Allergies Patient has no known allergies.  No family history on file.  Social History Social History   Tobacco Use  . Smoking status: Current Every Day Smoker  . Smokeless tobacco: Never Used  Substance Use Topics  . Alcohol use: Not Currently    Comment: last drink 7-8 months ago  . Drug use: Never    Review of Systems  Review of Systems  Constitutional: Negative for chills, fatigue and fever.  HENT: Negative for sore throat.   Respiratory: Negative for shortness of breath.   Cardiovascular: Negative for chest pain.  Gastrointestinal: Negative for abdominal pain.  Genitourinary: Negative for flank pain.  Musculoskeletal: Negative for neck pain.  Skin: Negative for rash and wound.  Allergic/Immunologic: Negative for immunocompromised state.  Neurological: Negative for weakness and numbness.  Hematological: Does not bruise/bleed easily.  Psychiatric/Behavioral: Positive for confusion.     ____________________________________________  PHYSICAL EXAM:      VITAL SIGNS: ED Triage Vitals  Enc Vitals Group     BP  09/26/20 1015 128/85     Pulse Rate 09/26/20 1015 91     Resp 09/26/20 1015 18     Temp 09/26/20 1015 98.3 F (36.8 C)     Temp Source 09/26/20 1015 Oral     SpO2 09/26/20 1015 100 %     Weight 09/26/20 1016 165 lb (74.8 kg)     Height 09/26/20 1016 6' (1.829 m)     Head Circumference --      Peak Flow --      Pain Score 09/26/20 1029 0     Pain Loc --      Pain Edu? --      Excl. in GC? --      Physical Exam Vitals and  nursing note reviewed.  Constitutional:      General: He is not in acute distress.    Appearance: He is well-developed.  HENT:     Head: Normocephalic and atraumatic.  Eyes:     Conjunctiva/sclera: Conjunctivae normal.  Cardiovascular:     Rate and Rhythm: Normal rate and regular rhythm.     Heart sounds: Normal heart sounds. No murmur heard.  No friction rub.  Pulmonary:     Effort: Pulmonary effort is normal. No respiratory distress.     Breath sounds: Normal breath sounds. No wheezing or rales.  Abdominal:     General: There is no distension.     Palpations: Abdomen is soft.     Tenderness: There is no abdominal tenderness.  Musculoskeletal:     Cervical back: Neck supple.  Skin:    General: Skin is warm.     Capillary Refill: Capillary refill takes less than 2 seconds.  Neurological:     Mental Status: He is alert and oriented to person, place, and time.     Motor: No abnormal muscle tone.      Neurological Exam:  Mental Status: Alert and oriented to person, place, and time. Attention and concentration normal. Speech clear. Recent memory is intact. Cranial Nerves: Visual fields grossly intact. EOMI and PERRLA. No nystagmus noted. Facial sensation intact at forehead, maxillary cheek, and chin/mandible bilaterally. No facial asymmetry or weakness. Hearing grossly normal. Uvula is midline, and palate elevates symmetrically. Normal SCM and trapezius strength. Tongue midline without fasciculations. Motor: Muscle strength 5/5 in proximal and distal UE and LE bilaterally. No pronator drift. Muscle tone normal.   Sensation: Intact to light touch in upper and lower extremities distally bilaterally.  Gait: Normal without ataxia. Coordination: Normal FTN bilaterally.    ____________________________________________   LABS (all labs ordered are listed, but only abnormal results are displayed)  Labs Reviewed  BASIC METABOLIC PANEL - Abnormal; Notable for the following components:       Result Value   Potassium 3.4 (*)    All other components within normal limits  URINALYSIS, COMPLETE (UACMP) WITH MICROSCOPIC - Abnormal; Notable for the following components:   Color, Urine YELLOW (*)    APPearance CLEAR (*)    All other components within normal limits  CBC  CBG MONITORING, ED    ____________________________________________  EKG: Normal sinus rhythm, rate 94.  PR 114, QRS 88, QTc 420.  No acute ST elevations or depressions.  No EKG evidence of acute arrhythmia, ischemia, or infarct. ________________________________________  RADIOLOGY All imaging, including plain films, CT scans, and ultrasounds, independently reviewed by me, and interpretations confirmed via formal radiology reads.  ED MD interpretation:   CT head: Negative, no acute abnormality  Official radiology report(s):  CT HEAD WO CONTRAST  Result Date: 09/26/2020 CLINICAL DATA:  Syncope. EXAM: CT HEAD WITHOUT CONTRAST TECHNIQUE: Contiguous axial images were obtained from the base of the skull through the vertex without intravenous contrast. COMPARISON:  02/09/2019 FINDINGS: Brain: There is no evidence for acute hemorrhage, hydrocephalus, mass lesion, or abnormal extra-axial fluid collection. No definite CT evidence for acute infarction. Vascular: No hyperdense vessel or unexpected calcification. Skull: No evidence for fracture. No worrisome lytic or sclerotic lesion. Sinuses/Orbits: Polypoid mucosal disease in the right sphenoid sinus is stable. Visualized portions of the globes and intraorbital fat are unremarkable. Other: None. IMPRESSION: Stable.  Unremarkable CT appearance of the brain. Electronically Signed   By: Kennith Center M.D.   On: 09/26/2020 11:25    ____________________________________________  PROCEDURES   Procedure(s) performed (including Critical Care):  Procedures  ____________________________________________  INITIAL IMPRESSION / MDM / ASSESSMENT AND PLAN / ED COURSE  As part of  my medical decision making, I reviewed the following data within the electronic MEDICAL RECORD NUMBER Nursing notes reviewed and incorporated, Old chart reviewed, Notes from prior ED visits, and [place] Controlled Substance Database       *Fordyce Lepak was evaluated in Emergency Department on 09/26/2020 for the symptoms described in the history of present illness. He was evaluated in the context of the global COVID-19 pandemic, which necessitated consideration that the patient might be at risk for infection with the SARS-CoV-2 virus that causes COVID-19. Institutional protocols and algorithms that pertain to the evaluation of patients at risk for COVID-19 are in a state of rapid change based on information released by regulatory bodies including the CDC and federal and state organizations. These policies and algorithms were followed during the patient's care in the ED.  Some ED evaluations and interventions may be delayed as a result of limited staffing during the pandemic.*     Medical Decision Making: 39 year old male here with brief episodes of amnesia.  Unclear etiology, with differential including transient global amnesia episodes, possible underlying psychiatric disease, less likely highly atypical complex seizure activity.  He does have history of alcohol use so Warnicke/Korsakoff is a consideration, though he has no additional symptoms of neurological deficits.  CT head is negative.  Neuro exam is nonfocal.  His lab work is unremarkable, with mild hypokalemia but otherwise no acute changes.  No fever, no headache, or evidence of meningitis or encephalitis.  He is neurologically intact here.  Will refer him to neurology for further follow-up, advised him to start a thiamine supplement, and give good return precautions.  No apparent emergent pathology at this time.  I did discuss that he should not operate any heavy machinery or be around any possibly dangerous situations until he has this cleared, including no  driving.  Return precautions given.  ____________________________________________  FINAL CLINICAL IMPRESSION(S) / ED DIAGNOSES  Final diagnoses:  Transient amnesia  History of alcohol dependence (HCC)     MEDICATIONS GIVEN DURING THIS VISIT:  Medications - No data to display   ED Discharge Orders         Ordered    thiamine 100 MG tablet  Daily        09/26/20 1408           Note:  This document was prepared using Dragon voice recognition software and may include unintentional dictation errors.   Shaune Pollack, MD 09/26/20 [phone]

## 2020-09-26 NOTE — ED Triage Notes (Signed)
Pt arrived to ED with c/o "random blackouts".   Pt states last random blackout was Monday at home.   Pt states he's been eating/drinking per normal.   Pt states hx of alcohol abuse. Pt states he has not had a drink in 7-8 months. Pt denies drug use at this time.

## 2020-09-26 NOTE — Discharge Instructions (Signed)
Follow-up with a neurologist in the next 1-2 weeks  Take the thiamine supplement as prescribed

## 2020-09-26 NOTE — ED Notes (Signed)
Pt alert and oriented X 4, stable for discharge. RR even and unlabored, color WNL. Discussed discharge instructions and follow-up as directed. Discharge medications discussed if provided. Pt had opportunity to ask questions if necessary and RN to provide patient/family eduction.  

## 2020-09-27 ENCOUNTER — Telehealth: Payer: Self-pay | Admitting: Gerontology

## 2020-09-27 NOTE — Telephone Encounter (Signed)
LVM @1 :23 pm on 09/27/20 about pay [id number]-[id number]

## 2020-10-02 ENCOUNTER — Encounter: Payer: Self-pay | Admitting: Gerontology

## 2020-10-02 ENCOUNTER — Ambulatory Visit: Payer: Medicaid Other | Admitting: Gerontology

## 2020-10-02 ENCOUNTER — Encounter: Payer: Self-pay | Admitting: Licensed Clinical Social Worker

## 2020-10-02 ENCOUNTER — Other Ambulatory Visit: Payer: Self-pay

## 2020-10-02 VITALS — BP 115/81 | HR 97 | Wt 164.1 lb

## 2020-10-02 DIAGNOSIS — R55 Syncope and collapse: Secondary | ICD-10-CM | POA: Insufficient documentation

## 2020-10-02 DIAGNOSIS — F1011 Alcohol abuse, in remission: Secondary | ICD-10-CM

## 2020-10-02 DIAGNOSIS — Z8659 Personal history of other mental and behavioral disorders: Secondary | ICD-10-CM | POA: Insufficient documentation

## 2020-10-02 DIAGNOSIS — Z7689 Persons encountering health services in other specified circumstances: Secondary | ICD-10-CM | POA: Insufficient documentation

## 2020-10-02 MED ORDER — FOLIC ACID 1 MG PO TABS: 1.0000 mg | ORAL_TABLET | Freq: Every day | ORAL | 0 refills | Status: AC

## 2020-10-02 MED ORDER — THIAMINE HCL 100 MG PO TABS: 100.0000 mg | ORAL_TABLET | Freq: Every day | ORAL | 0 refills | Status: AC

## 2020-10-02 NOTE — Progress Notes (Signed)
Patient ID: Aaron Jensen, male   DOB: 05/22/1981, 39 y.o.   MRN: [id number]  No chief complaint on file.   HPI Aaron Jensen is a 39 y.o. male who presents to establish care and evaluation of his chronic conditions. He states that he has a history of "blacking out", and has no recollection of the incident. He reports that he has been having black out for 2-3 years, which might be secondary to heavy alcohol consumption. Currently, he denies consumption of alcoholic beverages for the past 8 months and he continues to black out. He states that he blacks out once or twice a week. He reports that he sat in a puddle of water and had no idea that he did it. He denies any syncopal episodes, and any symptoms preceding his black ou. He reports that his co workers stated that his recent episode lasted 5-10 minutes. He was seen at the ED on 09/26/2020 for black out "Transinet global Amnesia"and he was referred to Neurology. CT scan done during visit was stable and unremarkable CT appearance of the brain per Dr Molli Posey E. He also has a history depression, states that his mood is good, denies suicidal nor homicidal ideation and he requests referral to [facility] team. Overall, he states that he is doing well and offers no further complaint.  Past Medical History:  Diagnosis Date  . Depression     History reviewed. No pertinent surgical history.  History reviewed. No pertinent family history.  Social History Social History   Tobacco Use  . Smoking status: Current Every Day Smoker  . Smokeless tobacco: Never Used  Substance Use Topics  . Alcohol use: Not Currently    Comment: last drink 7-8 months ago  . Drug use: Never    No Known Allergies  Current Outpatient Medications  Medication Sig Dispense Refill  . folic acid (FOLVITE) 1 MG tablet Take 1 tablet (1 mg total) by mouth daily. 30 tablet 0  . thiamine 100 MG tablet Take 1 tablet (100 mg total) by mouth daily. 30 tablet 0   No  current facility-administered medications for this visit.    Review of Systems Review of Systems  Constitutional: Negative.   HENT: Negative.   Eyes: Negative.   Respiratory: Negative.   Cardiovascular: Negative.   Gastrointestinal: Negative.   Endocrine: Negative.   Genitourinary: Negative.   Musculoskeletal: Negative.   Skin: Negative.   Neurological: Negative.   Hematological: Negative.   Psychiatric/Behavioral: Negative.     Blood pressure 115/81, pulse 97, weight 164 lb 1.6 oz (74.4 kg), SpO2 95 %.  Physical Exam Physical Exam Constitutional:      Appearance: Normal appearance.  HENT:     Head: Normocephalic and atraumatic.     Nose:     Comments: Deferred per Covid protocol    Mouth/Throat:     Comments: Deferred per Covid protocol Eyes:     Extraocular Movements: Extraocular movements intact.     Conjunctiva/sclera: Conjunctivae normal.     Pupils: Pupils are equal, round, and reactive to light.  Cardiovascular:     Rate and Rhythm: Normal rate and regular rhythm.     Pulses: Normal pulses.     Heart sounds: Normal heart sounds.  Pulmonary:     Effort: Pulmonary effort is normal.     Breath sounds: Normal breath sounds.  Abdominal:     General: Abdomen is flat. Bowel sounds are normal.     Palpations: Abdomen is soft.  Genitourinary:  Comments: Deferred per patient Musculoskeletal:        General: Normal range of motion.     Cervical back: Normal range of motion.  Skin:    General: Skin is warm and dry.  Neurological:     General: No focal deficit present.     Mental Status: He is alert and oriented to person, place, and time. Mental status is at baseline.  Psychiatric:        Mood and Affect: Mood normal.        Behavior: Behavior normal.        Thought Content: Thought content normal.        Judgment: Judgment normal.     Data Reviewed Lab and past medical history was reviewed.   Assessment and Plan  1. Encounter to establish care -  Routine labs will be checked - Hepatic function panel; Future - TSH; Future  2. History of depression - He will follow up with Behavioral Health Ms. Pruitt H  3. Black-out (not amnesia) - He was advised to go to the ED for worsening symptoms and complete [facility] financial application for - Ambulatory referral to Neurology  4. History of alcohol abuse - He will continue on Folic acid and Thiamine. He was encouraged for alcohol abstinence for 8 months and advised to call the Crisis Help Line if needed. - thiamine 100 MG tablet; Take 1 tablet (100 mg total) by mouth daily.  Dispense: 30 tablet; Refill: 0 - folic acid (FOLVITE) 1 MG tablet; Take 1 tablet (1 mg total) by mouth daily.  Dispense: 30 tablet; Refill: 0   Follow up: 10/25/2020 if symptoms worsen or fail to improve.  Essica Kiker E Weston Fulco 10/02/2020, 2:18 PM

## 2020-10-03 NOTE — Progress Notes (Signed)
This encounter was created in error - please disregard.

## 2020-10-09 ENCOUNTER — Ambulatory Visit: Payer: Medicaid Other | Admitting: Licensed Clinical Social Worker

## 2020-10-10 ENCOUNTER — Other Ambulatory Visit: Payer: Self-pay

## 2020-10-10 ENCOUNTER — Telehealth: Payer: Self-pay

## 2020-10-10 ENCOUNTER — Other Ambulatory Visit: Payer: Medicaid Other

## 2020-10-10 DIAGNOSIS — Z7689 Persons encountering health services in other specified circumstances: Secondary | ICD-10-CM

## 2020-10-10 NOTE — Telephone Encounter (Signed)
Called patient to discuss question regarding neurology referral.  Confirmed Lanora Manis, NP at [facility] ordered the referral.  He stated he had received a message from someone with neurology, but wasn't able to call them back at the number they called from.  Relayed main number for [facility] to patient so he could follow up with them.  Inquired if patient had active [facility].  Per patient, he's pretty sure he received the application and is working to gather the documents needed.  If he cannot find the [facility] application he plans to stop by [facility] to pick up another copy.

## 2020-10-11 ENCOUNTER — Telehealth: Payer: Self-pay | Admitting: Licensed Clinical Social Worker

## 2020-10-11 LAB — HEPATIC FUNCTION PANEL
ALT: 50 IU/L — ABNORMAL HIGH (ref 0–44)
AST: 44 IU/L — ABNORMAL HIGH (ref 0–40)
Albumin: 4.4 g/dL (ref 4.0–5.0)
Alkaline Phosphatase: 85 IU/L (ref 44–121)
Bilirubin Total: 0.4 mg/dL (ref 0.0–1.2)
Bilirubin, Direct: 0.11 mg/dL (ref 0.00–0.40)
Total Protein: 6.6 g/dL (ref 6.0–8.5)

## 2020-10-11 LAB — TSH: TSH: 1.82 u[IU]/mL (ref 0.450–4.500)

## 2020-10-11 NOTE — Telephone Encounter (Signed)
Called and confirmed phone appointment for October 26 at 10 am to see Rhett Bannister with patient.

## 2020-10-15 ENCOUNTER — Other Ambulatory Visit: Payer: Self-pay

## 2020-10-15 ENCOUNTER — Ambulatory Visit: Payer: Medicaid Other | Admitting: Pharmacy Technician

## 2020-10-15 DIAGNOSIS — Z79899 Other long term (current) drug therapy: Secondary | ICD-10-CM

## 2020-10-16 ENCOUNTER — Institutional Professional Consult (permissible substitution): Payer: Medicaid Other | Admitting: Licensed Clinical Social Worker

## 2020-10-16 NOTE — Progress Notes (Signed)
Completed [facility] application and contract.  Patient agreed to all terms of the [facility] contract.    Patient approved to receive medication assistance at [facility] until time for re-certification in 4401, and as long as eligibility criteria continues to be met.    Provided patient with community resource material based on her particular needs.    [facility]

## 2020-10-18 ENCOUNTER — Telehealth: Payer: Self-pay | Admitting: Licensed Clinical Social Worker

## 2020-10-18 NOTE — Telephone Encounter (Signed)
Called patient and rescheduled his appointment to see [facility].

## 2020-10-23 ENCOUNTER — Telehealth: Payer: Self-pay

## 2020-10-23 ENCOUNTER — Institutional Professional Consult (permissible substitution): Payer: Medicaid Other | Admitting: Licensed Clinical Social Worker

## 2020-10-23 NOTE — Telephone Encounter (Signed)
Called patient and informed him that he is being referred to [facility].  Patient was provided with [facility]'s phone number, address, and hours.  Patient was made aware that his appointment for November 17th with Aaron Jensen has been cancelled.

## 2020-10-25 ENCOUNTER — Other Ambulatory Visit: Payer: Self-pay

## 2020-10-25 ENCOUNTER — Ambulatory Visit: Payer: Medicaid Other | Admitting: Gerontology

## 2020-10-25 VITALS — BP 121/81 | Ht 72.75 in | Wt 163.5 lb

## 2020-10-25 DIAGNOSIS — R55 Syncope and collapse: Secondary | ICD-10-CM

## 2020-10-25 DIAGNOSIS — R7989 Other specified abnormal findings of blood chemistry: Secondary | ICD-10-CM

## 2020-10-25 NOTE — Progress Notes (Signed)
  [facility] [place]   Progress Note: General Provider: Regino Bellow, NP  SUBJECTIVE:   Aaron Jensen is a 39 y.o. male who  has a past medical history of Depression. Patient presents today for a neurology referral.  He states that he has a history of heavy alcohol consumption x 15 yrs, and has not had alcohol for the last four months [place]. He states that he has been having  "blacked out" for the past two years, and the last time he had a blackout was 2-3 weeks ago. He states that the blackout lasts between 5 to 10 minutes. He doesn't know the precipitating factors, and he reports that one example of his "blacked out" episode was sitting on the table and attempting to drive it; he reports that he doesn't have any recollection of the episodes. He denies loss of consciousness, seizure, change of vision, or loss of bowel and bladder control. He went to [facility] center on 09/26/2020 complaining of "blacked out" CT head negative, no acute abnormality. He states that he's compliant with his medications Overall he reports he is doing well and offers no further complaints.  Review of Systems  Constitutional: Negative.   Respiratory: Negative.   Cardiovascular: Negative.   Musculoskeletal: Negative.   Neurological: Negative.   Psychiatric/Behavioral: Negative.      OBJECTIVE: BP 121/81   Ht 6' 0.75" (1.848 m)   Wt 163 lb 8 oz (74.2 kg)   BMI 21.72 kg/m   Wt Readings from Last 3 Encounters:  10/25/20 163 lb 8 oz (74.2 kg)  10/02/20 164 lb 1.6 oz (74.4 kg)  09/26/20 165 lb (74.8 kg)     Physical Exam Constitutional:      Appearance: Normal appearance.  HENT:     Head: Normocephalic.  Cardiovascular:     Rate and Rhythm: Normal rate and regular rhythm.     Pulses: Normal pulses.     Heart sounds: Normal heart sounds.  Pulmonary:     Effort: Pulmonary effort is normal.     Breath sounds: Normal breath sounds.  Abdominal:      General: Abdomen is flat. Bowel sounds are normal.     Palpations: Abdomen is soft.  Musculoskeletal:        General: Normal range of motion.     Cervical back: Normal range of motion.  Skin:    General: Skin is warm and dry.  Neurological:     General: No focal deficit present.     Mental Status: He is alert and oriented to person, place, and time.  Psychiatric:        Mood and Affect: Mood normal.     ASSESSMENT/PLAN:  1. Black-out (not amnesia)  -[facility] charity care application provided - Ambulatory referral to Neurology  2. Elevated liver function tests  -Advised DASH diet and daily exercise as tolerated. -Advised to remain abstinent from alcohol use. - Hepatic function panel - Hepatitis, Acute  Return in about 3 weeks (around 11/15/2020), or if symptoms worsen or fail to improve.    The patient was given clear instructions to go to ER or return to medical center if symptoms do not improve, worsen or new problems develop. The patient verbalized understanding and agreed with plan of care.  Douglas Rooks, AGNP [facility]

## 2020-10-25 NOTE — Patient Instructions (Signed)
Liver Function Tests Why am I having this test? Liver function tests are done to see how well your liver is working. The proteins and enzymes measured in the test can alert your health care provider to inflammation, damage, or disease in your liver. It is common to have liver function tests:  When you are taking certain medicines.  If you have liver disease.  If you drink a lot of alcohol.  When you are not feeling well.  When you have other conditions that may affect your liver.  During annual physical exams.  If you have symptoms such as yellowing of the skin (jaundice), abdominal pain, or nausea and vomiting. What is being tested? These tests measure various substances in your blood. This may include:  Alanine transaminase (ALT). This is an enzyme in the liver.  Aspartate transaminase (AST). This is an enzyme in the liver, heart, and muscles.  Alkaline phosphatase (ALP). This is a protein in the liver, bile ducts, bone, and other body tissues.  Total bilirubin. This is a yellow pigment in bile.  Albumin. This is a protein in the liver.  Prothrombin time and international normalized ratio (PT and INR). PT measures the time it takes for your blood to clot. INR is a calculation of blood clotting time based on your PT result. It is also calculated based on normal ranges defined by the lab that processed your test.  Total protein. This includes two proteins, albumin and globulin, found in the blood. What kind of sample is taken?  A blood sample is required for this test. It is usually collected by inserting a needle into a blood vessel. How do I prepare for this test? How you prepare will depend on which tests are being done and the reason for doing them. You may need to:  Avoid eating for 4-6 hours before the test, or as told by your health care provider.  Stop taking certain medicines before your blood test, as told by your health care provider. Tell a health care provider  about:  All medicines you are taking, including vitamins, herbs, eye drops, creams, and over-the-counter medicines.  Any medical conditions you have.  Whether you are pregnant or may be pregnant. How are the results reported? Your test results will be reported as values. Your health care provider will compare your results to normal ranges that were established after testing a large group of people (reference ranges). Reference ranges may vary among labs and hospitals. For the substances measured in liver function tests, common reference ranges are: ALT  Infant: 10-40 international units/L.  Child or adult: 4-36 international units/L at 37C or 4-36 units/L (SI units).  Reference ranges may be higher for older adults. AST  Newborn 38-24 days old: 35-140 units/L.  Child younger than 25 years old: 15-60 units/L.  57-71 years old: 15-50 units/L.  54-56 years old: 10-50 units/L.  70-59 years old: 10-40 units/L.  Adult: 0-35 units/L or 0-0.58 microkatals/L (SI units).  Reference ranges may be higher for older adults. ALP  Child younger than 81 years old: 85-235 units/L.  73-82 years old: 65-210 units/L.  81-34 years old: 60-300 units/L.  68-77 years old: 30-200 units/L.  Adult: 30-120 units/L or 0.5-2.0 microkatals/L (SI units).  Reference ranges may be higher for older adults. Total bilirubin  Newborn: 1.0-12.0 mg/dL or 17.1-205 micromoles/L (SI units).  Child or adult: 0.3-1.0 mg/dL or 5.1-17 micromoles/L. Albumin  Premature infant: 3.0-4.2 g/dL.  Newborn: 3.5-5.4 g/dL.  Infant: 4.4-5.4 g/dL.  Child:  4.0-5.9 g/dL.  Adult: 3.5-5.0 g/dL or 27-74 g/L (SI units). PT  11.0-12.5 seconds; 85%-100%. INR  0.8-1.1. Total protein  Premature infant: 4.2-7.6 g/dL.  Newborn: 4.6-7.4 g/dL.  Infant: 6.0-6.7 g/dL.  Child: 6.2-8.0 g/dL.  Adult: 6.4-8.3 g/dL or 12-87 g/L (SI units). What do the results mean? Results that are within the reference ranges are considered  normal. For each substance measured, results outside the reference range can indicate various health issues. ALT  Levels above the normal range may indicate liver disease. AST  Levels above the normal range may indicate liver disease. Sometimes levels also increase after burns, surgery, heart attack, muscle damage, or seizure. ALP  Levels above the normal range may be seen in biliary obstruction, liver diseases, bone disease, thyroid disease, tumors, fractures, leukemia, lymphoma, or several other conditions. People with blood type O or B may show higher levels after a fatty meal.  Levels below the normal range may indicate bone and teeth conditions, malnutrition, protein deficiency, or Wilson's disease. Total bilirubin  Levels above the normal range may indicate problems with the liver, gallbladder, or bile ducts. Albumin  Levels above the normal range may indicate dehydration. They may also be caused by a diet that is high in protein.  Levels below the normal range may indicate kidney disease, liver disease, or malabsorption of nutrients. PT and INR  Levels above the normal range mean that your blood is clotting slower than normal. This may be due to blood disorders, liver disorders, or low levels of vitamin K. Total protein  Levels above the normal range may be due to infection or other diseases.  Levels below the normal range may be due to an immune system disorder, bleeding, burns, kidney disorder, liver disease, trouble absorbing or getting nutrients, or other conditions that affect the intestines. Talk with your health care provider about what your results mean. Questions to ask your health care provider Ask your health care provider, or the department that is doing the test:  When will my results be ready?  How will I get my results?  What are my treatment options?  What other tests do I need?  What are my next steps? Summary  Liver function tests are done to see  how well your liver is working.  These tests measure various proteins and enzymes in your blood. The results can alert your health care provider to inflammation, damage, or disease in your liver.  Talk with your health care provider about what your results mean. This information is not intended to replace advice given to you by your health care provider. Make sure you discuss any questions you have with your health care provider. Document Revised: 07/28/2018 Document Reviewed: 09/22/2017 Elsevier Patient Education  2020 [employer]. Alcohol Abuse and Dependence Information, Adult Alcohol is a widely available drug. People drink alcohol in different amounts. People who drink alcohol very often and in large amounts often have problems during and after drinking. They may develop what is called an alcohol use disorder. There are two main types of alcohol use disorders:  Alcohol abuse. This is when you use alcohol too much or too often. You may use alcohol to make yourself feel happy or to reduce stress. You may have a hard time setting a limit on the amount you drink.  Alcohol dependence. This is when you use alcohol consistently for a period of time, and your body changes as a result. This can make it hard to stop drinking because you may start  to feel sick or feel different when you do not use alcohol. These symptoms are known as withdrawal. How can alcohol abuse and dependence affect me? Alcohol abuse and dependence can have a negative effect on your life. Drinking too much can lead to addiction. You may feel like you need alcohol to function normally. You may drink alcohol before work in the morning, during the day, or as soon as you get home from work in the evening. These actions can result in:  Poor work performance.  Job loss.  Financial problems.  Car crashes or criminal charges from driving after drinking alcohol.  Problems in your relationships with friends and family.  Losing the  trust and respect of coworkers, friends, and family. Drinking heavily over a long period of time can permanently damage your body and brain, and can cause lifelong health issues, such as:  Damage to your liver or pancreas.  Heart problems, high blood pressure, or stroke.  Certain cancers.  Decreased ability to fight infections.  Brain or nerve damage.  Depression.  Early (premature) death. If you are careless or you crave alcohol, it is easy to drink more than your body can handle (overdose). Alcohol overdose is a serious situation that requires hospitalization. It may lead to permanent injuries or death. What can increase my risk?  Having a family history of alcohol abuse.  Having depression or other mental health conditions.  Beginning to drink at an early age.  Binge drinking often.  Experiencing trauma, stress, and an unstable home life during childhood.  Spending time with people who drink often. What actions can I take to prevent or manage alcohol abuse and dependence?  Do not drink alcohol if: ? Your health care provider tells you not to drink. ? You are pregnant, may be pregnant, or are planning to become pregnant.  If you drink alcohol: ? Limit how much you use to:  0-1 drink a day for women.  0-2 drinks a day for men. ? Be aware of how much alcohol is in your drink. In the [place] one drink equals one 12 oz bottle of beer (355 mL), one 5 oz glass of wine (148 mL), or one 1 oz glass of hard liquor (44 mL).  Stop drinking if you have been drinking too much. This can be very hard to do if you are used to abusing alcohol. If you begin to have withdrawal symptoms, talk with your health care provider or a person that you trust. These symptoms may include anxiety, shaky hands, headache, nausea, sweating, or not being able to sleep.  Choose to drink nonalcoholic beverages in social gatherings and places where there may be alcohol. Activity  Spend more time on  activities that you enjoy that do not involve alcohol, like hobbies or exercise.  Find healthy ways to cope with stress, such as exercise, meditation, or spending time with people you care about. General information  Talk to your family, coworkers, and friends about supporting you in your efforts to stop drinking. If they drink, ask them not to drink around you. Spend more time with people who do not drink alcohol.  If you think that you have an alcohol dependency problem: ? Tell friends or family about your concerns. ? Talk with your health care provider or another health professional about where to get help. ? Work with a Paramedic and a Network engineer. ? Consider joining a support group for people who struggle with alcohol abuse and dependence. Where to  find support   Your health care provider.  SMART Recovery: www.smartrecovery.org Therapy and support groups  Local treatment centers or chemical dependency counselors.  Local AA groups in your community: SalaryStart.tn Where to find more information  Centers for Disease Control and Prevention: FootballExhibition.com.br  [employer] on Alcohol Abuse and Alcoholism: BasicStudents.dk  Alcoholics Anonymous (AA): SalaryStart.tn Contact a health care provider if:  You drank more or for longer than you intended on more than one occasion.  You tried to stop drinking or to cut back on how much you drink, but you were not able to.  You often drink to the point of vomiting or passing out.  You want to drink so badly that you cannot think about anything else.  You have problems in your life due to drinking, but you continue to drink.  You keep drinking even though you feel anxious, depressed, or have experienced memory loss.  You have stopped doing the things you used to enjoy in order to drink.  You have to drink more than you used to in order to get the effect you want.  You experience anxiety, sweating, nausea, shakiness,  and trouble sleeping when you try to stop drinking. Get help right away if:  You have thoughts about hurting yourself or others.  You have serious withdrawal symptoms, including: ? Confusion. ? Racing heart. ? High blood pressure. ? Fever. If you ever feel like you may hurt yourself or others, or have thoughts about taking your own life, get help right away. You can go to your nearest emergency department or call:  Your local emergency services (911 in the [place]  A suicide crisis helpline, such as the National Suicide Prevention Lifeline at 458-639-0578. This is open 24 hours a day. Summary  Alcohol abuse and dependence can have a negative effect on your life. Drinking too much or too often can lead to addiction.  If you drink alcohol, limit how much you use.  If you are having trouble keeping your drinking under control, find ways to change your behavior. Hobbies, calming activities, exercise, or support groups can help.  If you feel you need help with changing your drinking habits, talk with your health care provider, a good friend, or a therapist, or go to an AA group. This information is not intended to replace advice given to you by your health care provider. Make sure you discuss any questions you have with your health care provider. Document Revised: 03/29/2019 Document Reviewed: 02/15/2019 Elsevier Patient Education  2020 [employer].

## 2020-10-30 ENCOUNTER — Other Ambulatory Visit: Payer: Self-pay | Admitting: Gerontology

## 2020-10-30 NOTE — Telephone Encounter (Signed)
Called patient and followed up as to if he had a chance to make it to [facility].  He had not and said he is planning to do so.  The beginning of the week is more difficult for him to make it there due to him employment.

## 2020-10-31 ENCOUNTER — Institutional Professional Consult (permissible substitution): Payer: Medicaid Other | Admitting: Licensed Clinical Social Worker

## 2020-11-06 ENCOUNTER — Other Ambulatory Visit: Payer: Medicaid Other

## 2020-11-06 ENCOUNTER — Other Ambulatory Visit: Payer: Self-pay

## 2020-11-06 DIAGNOSIS — R7989 Other specified abnormal findings of blood chemistry: Secondary | ICD-10-CM

## 2020-11-07 ENCOUNTER — Institutional Professional Consult (permissible substitution): Payer: Medicaid Other | Admitting: Licensed Clinical Social Worker

## 2020-11-07 LAB — HEPATITIS PANEL, ACUTE
Hep A IgM: NEGATIVE
Hep B C IgM: NEGATIVE
Hep C Virus Ab: >11 s/co ratio — ABNORMAL HIGH (ref 0.0–0.9)
Hepatitis B Surface Ag: NEGATIVE

## 2020-11-07 LAB — HEPATIC FUNCTION PANEL
ALT: 48 IU/L — ABNORMAL HIGH (ref 0–44)
AST: 47 IU/L — ABNORMAL HIGH (ref 0–40)
Albumin: 4.5 g/dL (ref 4.0–5.0)
Alkaline Phosphatase: 82 IU/L (ref 44–121)
Bilirubin Total: 0.4 mg/dL (ref 0.0–1.2)
Bilirubin, Direct: 0.14 mg/dL (ref 0.00–0.40)
Total Protein: 6.9 g/dL (ref 6.0–8.5)

## 2020-11-14 NOTE — Telephone Encounter (Signed)
Social work intern left message for patient following up on if he had the chance to make it to [facility]. Provided [facility]'s address, phone number, crisis phone number, and walk in hours.  Provided the [facility] phone number in case patient had any questions.

## 2020-11-22 ENCOUNTER — Other Ambulatory Visit: Payer: Self-pay

## 2020-11-22 ENCOUNTER — Ambulatory Visit: Payer: Medicaid Other | Admitting: Gerontology

## 2020-11-22 VITALS — BP 118/84 | HR 86 | Temp 97.7°F | Resp 18 | Wt 159.0 lb

## 2020-11-22 DIAGNOSIS — R748 Abnormal levels of other serum enzymes: Secondary | ICD-10-CM

## 2020-11-22 NOTE — Patient Instructions (Signed)
Alcoholic Liver Disease  Alcoholic liver disease is liver damage that is caused by drinking a lot of alcohol for a long time. If you have this disease, you must stop drinking alcohol. Follow these instructions at home:   Do not drink alcohol. Follow your treatment plan. Work with your doctor if you need help.  Think about joining an alcohol support group.  Take over-the-counter and prescription medicines only as told by your doctor. These include vitamins.  Do not use medicines or eat foods that have alcohol in them, unless your doctor says that it is safe.  Follow instructions from your doctor about eating a healthy diet.  Keep all follow-up visits as told by your doctor. This is important. Contact a doctor if:  You get a fever.  Your skin starts to look more yellow, pale, or dark.  You get headaches. Get help right away if:  You throw up (vomit) blood.  You have bright red blood in your poop (stool).  Your poop looks black or looks like tar.  You have trouble: ? Thinking. ? Walking. ? Balancing. ? Breathing. Summary  Alcoholic liver disease is liver damage that is caused by drinking a lot of alcohol for a long time.  If you have this disease, you must stop drinking alcohol. Follow your treatment plan, and work with your doctor as needed.  Think about joining an alcohol support group. This information is not intended to replace advice given to you by your health care provider. Make sure you discuss any questions you have with your health care provider. Document Revised: 03/29/2019 Document Reviewed: 08/28/2017 Elsevier Patient Education  2020 Elsevier Inc.  

## 2020-11-22 NOTE — Progress Notes (Signed)
  [facility]   Progress Note: General Provider: Regino Bellow, NP  SUBJECTIVE:   Aaron Jensen is a 39 y.o. male who  has a past medical history of Depression.. Patient presents today for lab review. Labs done on 11/06/20 showed Hep A IgM,Hep B C IgM and Hepatitis B surface Ag negative,  Hep C virus Ab >111.0. He states that he hasn't had an alcoholic drink in the past seven months. Denies jaundice to skin or sclera, fatigue, or loss of appetite. He denies ever being treated for Hep C. Overall, he states that he's doing well and offers no further complaint.     Review of Systems  Constitutional: Negative.   HENT: Negative.   Eyes: Negative.   Respiratory: Negative.   Cardiovascular: Negative.   Gastrointestinal: Negative.   Genitourinary: Negative.   Musculoskeletal: Negative.   Skin: Negative.   Neurological: Negative.   Psychiatric/Behavioral: Negative.      OBJECTIVE: BP 118/84   Pulse 86   Temp 97.7 F (36.5 C)   Resp 18   Wt 159 lb (72.1 kg)   SpO2 96%   BMI 21.12 kg/m   Wt Readings from Last 3 Encounters:  11/22/20 159 lb (72.1 kg)  10/25/20 163 lb 8 oz (74.2 kg)  10/02/20 164 lb 1.6 oz (74.4 kg)     Physical Exam Vitals reviewed.  Constitutional:      Appearance: Normal appearance.  HENT:     Nose: Nose normal.  Cardiovascular:     Rate and Rhythm: Normal rate.     Pulses: Normal pulses.     Heart sounds: Normal heart sounds.  Pulmonary:     Effort: Pulmonary effort is normal.     Breath sounds: Normal breath sounds.  Abdominal:     General: Abdomen is flat. Bowel sounds are normal.     Palpations: Abdomen is soft.  Musculoskeletal:        General: Normal range of motion.     Cervical back: Normal range of motion.  Skin:    General: Skin is warm and dry.  Neurological:     General: No focal deficit present.     Mental Status: He is alert and oriented to person, place, and time.  Psychiatric:        Mood and Affect: Mood  normal.        Behavior: Behavior normal.     ASSESSMENT/PLAN:  1. Abnormal liver enzymes -Advised to continue avoiding alcohol -Advised to avoid medications that contain acetaminophen  - HCV RNA quant   Return in about 2 weeks (around 12/06/2020), or if symptoms worsen or fail to improve.    The patient was given clear instructions to go to ER or return to medical center if symptoms do not improve, worsen or new problems develop. The patient verbalized understanding and agreed with plan of care.  Annelle Behrendt, AGNP [facility]

## 2020-11-28 ENCOUNTER — Other Ambulatory Visit: Payer: Medicaid Other

## 2020-11-28 ENCOUNTER — Other Ambulatory Visit: Payer: Self-pay

## 2020-11-28 DIAGNOSIS — R748 Abnormal levels of other serum enzymes: Secondary | ICD-10-CM

## 2020-11-28 NOTE — Progress Notes (Unsigned)
e

## 2020-12-04 LAB — HEPATITIS PANEL, ACUTE
Hep A IgM: NEGATIVE
Hep B C IgM: NEGATIVE
Hep C Virus Ab: >11 s/co ratio — ABNORMAL HIGH (ref 0.0–0.9)
Hepatitis B Surface Ag: NEGATIVE

## 2020-12-04 LAB — HCV RNA QUANT
HCV log10: 6.526 log10 IU/mL
Hepatitis C Quantitation: 3360000 IU/mL

## 2020-12-04 LAB — HEPATIC FUNCTION PANEL
ALT: 32 IU/L (ref 0–44)
AST: 34 IU/L (ref 0–40)
Albumin: 4.2 g/dL (ref 4.0–5.0)
Alkaline Phosphatase: 74 IU/L (ref 44–121)
Bilirubin Total: 0.6 mg/dL (ref 0.0–1.2)
Bilirubin, Direct: 0.18 mg/dL (ref 0.00–0.40)
Total Protein: 6.4 g/dL (ref 6.0–8.5)

## 2020-12-06 ENCOUNTER — Ambulatory Visit: Payer: Medicaid Other

## 2020-12-08 ENCOUNTER — Emergency Department
Admission: EM | Admit: 2020-12-08 | Discharge: 2020-12-10 | Disposition: A | Discharge status: Home / Self Care | Payer: Medicaid Other | Attending: Emergency Medicine | Admitting: Emergency Medicine

## 2020-12-08 ENCOUNTER — Other Ambulatory Visit: Payer: Self-pay

## 2020-12-08 ENCOUNTER — Emergency Department
Admission: EM | Admit: 2020-12-08 | Discharge: 2020-12-08 | Disposition: A | Discharge status: Home / Self Care | Payer: Medicaid Other | Attending: Emergency Medicine | Admitting: Emergency Medicine

## 2020-12-08 DIAGNOSIS — F10220 Alcohol dependence with intoxication, uncomplicated: Secondary | ICD-10-CM

## 2020-12-08 DIAGNOSIS — F10920 Alcohol use, unspecified with intoxication, uncomplicated: Secondary | ICD-10-CM

## 2020-12-08 DIAGNOSIS — Y906 Blood alcohol level of 120-199 mg/100 ml: Secondary | ICD-10-CM | POA: Insufficient documentation

## 2020-12-08 DIAGNOSIS — R413 Other amnesia: Secondary | ICD-10-CM | POA: Insufficient documentation

## 2020-12-08 DIAGNOSIS — F17200 Nicotine dependence, unspecified, uncomplicated: Secondary | ICD-10-CM | POA: Insufficient documentation

## 2020-12-08 DIAGNOSIS — Z20822 Contact with and (suspected) exposure to covid-19: Secondary | ICD-10-CM | POA: Insufficient documentation

## 2020-12-08 DIAGNOSIS — F1010 Alcohol abuse, uncomplicated: Secondary | ICD-10-CM

## 2020-12-08 DIAGNOSIS — F1410 Cocaine abuse, uncomplicated: Secondary | ICD-10-CM | POA: Insufficient documentation

## 2020-12-08 DIAGNOSIS — F10229 Alcohol dependence with intoxication, unspecified: Secondary | ICD-10-CM | POA: Insufficient documentation

## 2020-12-08 DIAGNOSIS — F1019 Alcohol abuse with unspecified alcohol-induced disorder: Secondary | ICD-10-CM | POA: Diagnosis present

## 2020-12-08 DIAGNOSIS — F10120 Alcohol abuse with intoxication, uncomplicated: Secondary | ICD-10-CM | POA: Insufficient documentation

## 2020-12-08 LAB — CBC
HCT: 43.9 % (ref 39.0–52.0)
HCT: 42.1 % (ref 39.0–52.0)
Hemoglobin: 15.2 g/dL (ref 13.0–17.0)
Hemoglobin: 15 g/dL (ref 13.0–17.0)
MCH: 33 pg (ref 26.0–34.0)
MCH: 33.6 pg (ref 26.0–34.0)
MCHC: 34.6 g/dL (ref 30.0–36.0)
MCHC: 35.6 g/dL (ref 30.0–36.0)
MCV: 95.2 fL (ref 80.0–100.0)
MCV: 94.2 fL (ref 80.0–100.0)
Platelets: 159 10*3/uL (ref 150–400)
Platelets: 171 10*3/uL (ref 150–400)
RBC: 4.61 MIL/uL (ref 4.22–5.81)
RBC: 4.47 MIL/uL (ref 4.22–5.81)
RDW: 12.6 % (ref 11.5–15.5)
RDW: 12.7 % (ref 11.5–15.5)
WBC: 6.4 10*3/uL (ref 4.0–10.5)
WBC: 5.6 10*3/uL (ref 4.0–10.5)
nRBC: 0 % (ref 0.0–0.2)
nRBC: 0 % (ref 0.0–0.2)

## 2020-12-08 LAB — COMPREHENSIVE METABOLIC PANEL
ALT: 41 U/L (ref 0–44)
ALT: 39 U/L (ref 0–44)
AST: 46 U/L — ABNORMAL HIGH (ref 15–41)
AST: 42 U/L — ABNORMAL HIGH (ref 15–41)
Albumin: 4.2 g/dL (ref 3.5–5.0)
Albumin: 4.1 g/dL (ref 3.5–5.0)
Alkaline Phosphatase: 69 U/L (ref 38–126)
Alkaline Phosphatase: 69 U/L (ref 38–126)
Anion gap: 12 (ref 5–15)
Anion gap: 12 (ref 5–15)
BUN: 11 mg/dL (ref 6–20)
BUN: 13 mg/dL (ref 6–20)
CO2: 20 mmol/L — ABNORMAL LOW (ref 22–32)
CO2: 21 mmol/L — ABNORMAL LOW (ref 22–32)
Calcium: 8.4 mg/dL — ABNORMAL LOW (ref 8.9–10.3)
Calcium: 8.1 mg/dL — ABNORMAL LOW (ref 8.9–10.3)
Chloride: 106 mmol/L (ref 98–111)
Chloride: 106 mmol/L (ref 98–111)
Creatinine, Ser: 0.9 mg/dL (ref 0.61–1.24)
Creatinine, Ser: 1.01 mg/dL (ref 0.61–1.24)
GFR, Estimated: >60 mL/min (ref >60)
GFR, Estimated: >60 mL/min (ref >60)
Glucose, Bld: 97 mg/dL (ref 70–99)
Glucose, Bld: 87 mg/dL (ref 70–99)
Potassium: 4 mmol/L (ref 3.5–5.1)
Potassium: 4 mmol/L (ref 3.5–5.1)
Sodium: 138 mmol/L (ref 135–145)
Sodium: 139 mmol/L (ref 135–145)
Total Bilirubin: 0.8 mg/dL (ref 0.3–1.2)
Total Bilirubin: 0.6 mg/dL (ref 0.3–1.2)
Total Protein: 7.2 g/dL (ref 6.5–8.1)
Total Protein: 7.2 g/dL (ref 6.5–8.1)

## 2020-12-08 LAB — SALICYLATE LEVEL: Salicylate Lvl: <7 mg/dL — ABNORMAL LOW (ref 7.0–30.0)

## 2020-12-08 LAB — RESP PANEL BY RT-PCR (FLU A&B, COVID) ARPGX2
Influenza A by PCR: NEGATIVE
Influenza B by PCR: NEGATIVE
SARS Coronavirus 2 by RT PCR: NEGATIVE

## 2020-12-08 LAB — ACETAMINOPHEN LEVEL: Acetaminophen (Tylenol), Serum: <10 ug/mL — ABNORMAL LOW (ref 10–30)

## 2020-12-08 LAB — ETHANOL
Alcohol, Ethyl (B): 195 mg/dL — ABNORMAL HIGH (ref <10)
Alcohol, Ethyl (B): 189 mg/dL — ABNORMAL HIGH (ref <10)

## 2020-12-08 MED ORDER — LORAZEPAM 2 MG PO TABS
0.0000 mg | ORAL_TABLET | Freq: Four times a day (QID) | ORAL | Status: DC
  Administered 2020-12-09 (×2): 1 mg via ORAL
  Filled 2020-12-08 (×2): qty 1

## 2020-12-08 MED ORDER — THIAMINE HCL 100 MG PO TABS
100.0000 mg | ORAL_TABLET | Freq: Every day | ORAL | Status: DC
  Administered 2020-12-09 – 2020-12-10 (×2): 100 mg via ORAL
  Filled 2020-12-08 (×2): qty 1

## 2020-12-08 NOTE — ED Notes (Signed)
RN contacted by Jerilynn Som Manning/ TTS regarding pt. RN instructed to given phone to pt for him to call [phone] to complete the phone interview process at [facility] in [place]. Pt given phone and attempting to call at this time

## 2020-12-08 NOTE — ED Notes (Signed)
Pt informed RN that the phone number provided to him was a fax number. RN dialed number to confirm, then contacted TTS to obtain another number. Pt informed that I am currently waiting for the correct number to be provided to [place].

## 2020-12-08 NOTE — BH Assessment (Signed)
Assessment Note  Aaron Jensen is an 39 y.o. male. Pt presented with an unremarkable appearance. Pt was drowsy but gave a lucid account of his current situation. Pt's speech was normal, logical, and coherent. Motor behavior appears normal evidenced by pt. rsting quietly. Pt's thought content was relevant to the context of the situation. Eye contact was good. Pt's mood is depressed, affect is flat. Patient was noted to have good insight as he expressed that his body is tired and that he does not want to use. Pt had a normal attention span. Pt reports that he is not connected to any mental health provider. 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 Pt reported that he relapsed after a four-month drug free period. Pt reports that his main stressor is having to leave the rescue mission and his drinking. Pt reported that he drinks about 10 40 oz beers daily and describes his cocaine use as sporadic. Pt explained that he last drink prior to presenting to the hospital and that he has a high tolerance. Pt reports using cocaine 2 nights ago. Pt expressed concerns about memory loss, explaining that he experiences black outs both while under the influence and when sober. The patient denied SI, HI, AV/hallucinations, or symptoms of paranoia. Pt reported identified his mother as his support system. Pt reported that he has no legal issues or abuse hx. Patient was polite and forthcoming throughout the interview process.  Diagnosis: Alcohol use disorder  Past Medical History:  Past Medical History:  Diagnosis Date  . Depression     No past surgical history on file.  Family History: No family history on file.  Social History:  reports that he has been smoking. He has never used smokeless tobacco. He reports previous alcohol use. He reports that he does not use drugs.  Additional Social History:  Alcohol / Drug Use Pain Medications: See  PTA Prescriptions: See PTA History of alcohol / drug use?: Yes Negative Consequences of Use: Financial,Personal relationships Substance #1 Name of Substance 1: Alcohol 1 - Amount (size/oz): 10 40 oz 1 - Frequency: Daily 1 - Duration: 2 weeks 1 - Last Use / Amount: PTA Substance #2 Name of Substance 2: Cocaine 2 - Frequency: Sporadic 2 - Duration: 2 weeks 2 - Last Use / Amount: 12/06/20  CIWA: CIWA-Ar BP: 105/78 Pulse Rate: (!) 106 Nausea and Vomiting: no nausea and no vomiting Tactile Disturbances: none Tremor: no tremor Auditory Disturbances: not present Paroxysmal Sweats: no sweat visible Visual Disturbances: not present Anxiety: mildly anxious Headache, Fullness in Head: moderately severe Agitation: somewhat more than normal activity Orientation and Clouding of Sensorium: oriented and can do serial additions CIWA-Ar Total: 6 COWS:    Allergies:  Allergies  Allergen Reactions  . Cat Hair Extract Hives    Home Medications: (Not in a hospital admission)   OB/GYN Status:  No LMP for male patient.  General Assessment Data Location of Assessment: [facility] ED TTS Assessment: In system Is this a Tele or Face-to-Face Assessment?: Face-to-Face Is this an Initial Assessment or a Re-assessment for this encounter?: Initial Assessment Patient Accompanied by:: N/A Language Other than English: No Living Arrangements: Homeless/Shelter What gender do you identify as?: Male Date Telepsych consult ordered in [facility]: 12/08/20 Time Telepsych consult ordered in [facility]: 0458 Marital status: Single Maiden name: n/a Pregnancy Status: No Living Arrangements: Other (Comment) (Pt is recently homeless) Can pt return to current living arrangement?: Yes Admission Status:  Voluntary Is patient capable of signing voluntary admission?: Yes Referral Source: Self/Family/Friend Insurance type: None  Medical Screening Exam [facility] Walk-in ONLY) Medical Exam completed: Yes  Crisis Care Plan Living  Arrangements: Other (Comment) (Pt is recently homeless) Legal Guardian: Other: (Self) Name of Psychiatrist: None Name of Therapist: None  Education Status Is patient currently in school?: No Is the patient employed, unemployed or receiving disability?: Unemployed  Risk to self with the past 6 months Suicidal Ideation: No Has patient been a risk to self within the past 6 months prior to admission? : No Suicidal Intent: No Has patient had any suicidal intent within the past 6 months prior to admission? : No Is patient at risk for suicide?: No Suicidal Plan?: No Has patient had any suicidal plan within the past 6 months prior to admission? : No Access to Means: No What has been your use of drugs/alcohol within the last 12 months?: Alcohol; Cocaine Previous Attempts/Gestures: No How many times?: 0 Other Self Harm Risks: n/a Triggers for Past Attempts: Other (Comment) (n/a) Intentional Self Injurious Behavior: None Family Suicide History: Unknown Recent stressful life event(s): Other (Comment),Conflict (Comment) (Having to leave the [facility] rescue mission) Persecutory voices/beliefs?: No Depression: Yes Depression Symptoms: Feeling worthless/self pity Substance abuse history and/or treatment for substance abuse?: Yes Suicide prevention information given to non-admitted patients: Not applicable  Risk to Others within the past 6 months Homicidal Ideation: No Does patient have any lifetime risk of violence toward others beyond the six months prior to admission? : No Thoughts of Harm to Others: No Current Homicidal Intent: No Current Homicidal Plan: No Access to Homicidal Means: No Identified Victim: n/a History of harm to others?: No Assessment of Violence: None Noted Violent Behavior Description: n/a Does patient have access to weapons?: No Criminal Charges Pending?: No Does patient have a court date: No Is patient on probation?: No  Psychosis Hallucinations: None  noted Delusions: None noted  Mental Status Report Appearance/Hygiene: Unremarkable Eye Contact: Fair Motor Activity: Freedom of movement Speech: Logical/coherent Level of Consciousness: Drowsy Mood: Depressed Affect: Flat Anxiety Level: Minimal Thought Processes: Relevant,Coherent Judgement: Impaired Orientation: Person,Place,Time,Situation Obsessive Compulsive Thoughts/Behaviors: None  Cognitive Functioning Concentration: Normal Memory: Recent Intact,Remote Intact Is patient IDD: No Insight: Fair Impulse Control: Fair Appetite: Poor Have you had any weight changes? : No Change Sleep: Decreased Total Hours of Sleep: 4 Vegetative Symptoms: None  ADLScreening [facility] Assessment Services) Patient's cognitive ability adequate to safely complete daily activities?: Yes Patient able to express need for assistance with ADLs?: Yes Independently performs ADLs?: Yes (appropriate for developmental age)  Prior Inpatient Therapy Prior Inpatient Therapy: No  Prior Outpatient Therapy Prior Outpatient Therapy: Yes Prior Therapy Dates: Early December 2021 Prior Therapy Facilty/Provider(s): Unable to recall therapist Reason for Treatment: Substance Abuse Does patient have an ACCT team?: No Does patient have Intensive In-House Services?  : No Does patient have [facility] services? : No Does patient have P4CC services?: No  ADL Screening (condition at time of admission) Patient's cognitive ability adequate to safely complete daily activities?: Yes Is the patient deaf or have difficulty hearing?: No Does the patient have difficulty seeing, even when wearing glasses/contacts?: No Does the patient have difficulty concentrating, remembering, or making decisions?: No Patient able to express need for assistance with ADLs?: Yes Does the patient have difficulty dressing or bathing?: No Independently performs ADLs?: Yes (appropriate for developmental age) Does the patient have difficulty walking  or climbing stairs?: No Weakness of Legs: None Weakness of Arms/Hands: None  Home Assistive Devices/Equipment Home Assistive Devices/Equipment: None  Therapy Consults (therapy consults require a physician order) PT Evaluation Needed: No OT Evalulation Needed: No SLP Evaluation Needed: No Abuse/Neglect Assessment (Assessment to be complete while patient is alone) Abuse/Neglect Assessment Can Be Completed: Yes Physical Abuse: Denies Verbal Abuse: Denies Sexual Abuse: Denies Exploitation of patient/patient's resources: Denies Self-Neglect: Denies Values / Beliefs Cultural Requests During Hospitalization: None Spiritual Requests During Hospitalization: None Consults Spiritual Care Consult Needed: No Transition of Care Team Consult Needed: No            Disposition: Patient will refer pt to residential detox/substance abuse facilities.   Disposition Initial Assessment Completed for this Encounter: Yes  On Site Evaluation by:   Reviewed with Physician:    Daphine Deutscher Dunia Pringle 12/08/2020 6:00 AM

## 2020-12-08 NOTE — ED Provider Notes (Signed)
[facility] Emergency Department Provider Note   ____________________________________________   Event Date/Time   First MD Initiated Contact with Patient 12/08/20 2333     (approximate)  I have reviewed the triage vital signs and the nursing notes.   HISTORY  Chief Complaint Detox    HPI Aaron Jensen is a 39 y.o. male who returns to the ED seeking help with alcohol and cocaine detox.  Patient was here overnight for same and eloped around 1030 this morning.  States he does not remember leaving.  Prefers to go to [facility].  Denies active SI/HI/AH/VH.  Has no medical complaints.     Past Medical History:  Diagnosis Date  . Depression     Patient Active Problem List   Diagnosis Date Noted  . Encounter to establish care 10/02/2020  . History of depression 10/02/2020  . Alcohol abuse with alcohol-induced disorder (HCC) 09/03/2019    No past surgical history on file.  Prior to Admission medications   Medication Sig Start Date End Date Taking? Authorizing Provider  folic acid (FOLVITE) 1 MG tablet Take 1 tablet (1 mg total) by mouth daily. Patient not taking: No sig reported 10/02/20   Iloabachie, Chioma E, NP    Allergies Cat hair extract  No family history on file.  Social History Social History   Tobacco Use  . Smoking status: Current Every Day Smoker  . Smokeless tobacco: Never Used  Substance Use Topics  . Alcohol use: Not Currently    Comment: last drink 7-8 months ago  . Drug use: Never    Review of Systems  Constitutional: No fever/chills Eyes: No visual changes. ENT: No sore throat. Cardiovascular: Denies chest pain. Respiratory: Denies shortness of breath. Gastrointestinal: No abdominal pain.  No nausea, no vomiting.  No diarrhea.  No constipation. Genitourinary: Negative for dysuria. Musculoskeletal: Negative for back pain. Skin: Negative for rash. Neurological: Negative for headaches, focal weakness or  numbness. Psychiatric:  Positive for alcohol and cocaine abuse.  ____________________________________________   PHYSICAL EXAM:  VITAL SIGNS: ED Triage Vitals  Enc Vitals Group     BP 12/08/20 2219 107/82     Pulse Rate 12/08/20 2219 (!) 103     Resp 12/08/20 2219 18     Temp 12/08/20 2219 98.6 F (37 C)     Temp Source 12/08/20 2219 Oral     SpO2 12/08/20 2219 95 %     Weight 12/08/20 2212 149 lb 14.6 oz (68 kg)     Height 12/08/20 2212 6' (1.829 m)     Head Circumference --      Peak Flow --      Pain Score 12/08/20 2212 0     Pain Loc --      Pain Edu? --      Excl. in GC? --     Constitutional: Alert and oriented. Well appearing and in no acute distress. Eyes: Conjunctivae are normal. PERRL. EOMI. Head: Atraumatic. Nose: No congestion/rhinnorhea. Mouth/Throat: Mucous membranes are moist.   Neck: No stridor.   Cardiovascular: Normal rate, regular rhythm. Grossly normal heart sounds.  Good peripheral circulation. Respiratory: Normal respiratory effort.  No retractions. Lungs CTAB. Gastrointestinal: Soft and nontender. No distention. No abdominal bruits. No CVA tenderness. Musculoskeletal: No lower extremity tenderness nor edema.  No joint effusions. Neurologic:  Normal speech and language. No gross focal neurologic deficits are appreciated. No gait instability. Skin:  Skin is warm, dry and intact. No rash noted. Psychiatric: Mood and affect are  normal. Speech and behavior are normal.  ____________________________________________   LABS (all labs ordered are listed, but only abnormal results are displayed)  Labs Reviewed  COMPREHENSIVE METABOLIC PANEL - Abnormal; Notable for the following components:      Result Value   CO2 21 (*)    Calcium 8.1 (*)    AST 42 (*)    All other components within normal limits  ETHANOL - Abnormal; Notable for the following components:   Alcohol, Ethyl (B) 189 (*)    All other components within normal limits  SALICYLATE LEVEL -  Abnormal; Notable for the following components:   Salicylate Lvl <7.0 (*)    All other components within normal limits  ACETAMINOPHEN LEVEL - Abnormal; Notable for the following components:   Acetaminophen (Tylenol), Serum <10 (*)    All other components within normal limits  URINE DRUG SCREEN, QUALITATIVE ([facility] ONLY) - Abnormal; Notable for the following components:   Cocaine Metabolite,Ur [place] POSITIVE (*)    All other components within normal limits  CBC   ____________________________________________  EKG  None ____________________________________________  RADIOLOGY I, Claretha Townshend J, personally viewed and evaluated these images (plain radiographs) as part of my medical decision making, as well as reviewing the written report by the radiologist.  ED MD interpretation: None  Official radiology report(s): No results found.  ____________________________________________   PROCEDURES  Procedure(s) performed (including Critical Care):  Procedures   ____________________________________________   INITIAL IMPRESSION / ASSESSMENT AND PLAN / ED COURSE  As part of my medical decision making, I reviewed the following data within the electronic MEDICAL RECORD NUMBER Nursing notes reviewed and incorporated, Labs reviewed, Old chart reviewed (12/08/2020 ED visit), A consult was requested and obtained from this/these consultant(s) TTS and Notes from prior ED visits     39 year old male who eloped earlier in the day returning to seek help with alcohol and cocaine detox. The patient has been placed in psychiatric observation due to the need to provide a safe environment for the patient while obtaining psychiatric consultation and evaluation, as well as ongoing medical and medication management to treat the patient's condition.  The patient has not been placed under full IVC at this time.   Clinical Course as of 12/09/20 0556  Wynelle Link Dec 09, 2020  9390 TTS has placed referrals to detox facilities.   Patient remains in the ED voluntarily pending detox placement. [JS]    Clinical Course User Index [JS] Irean Hong, MD     ____________________________________________   FINAL CLINICAL IMPRESSION(S) / ED DIAGNOSES  Final diagnoses:  Alcoholic intoxication without complication (HCC)  Alcohol abuse  Cocaine abuse [facility])     ED Discharge Orders    None      *Please note:  Aaron Jensen was evaluated in Emergency Department on 12/09/2020 for the symptoms described in the history of present illness. He was evaluated in the context of the global COVID-19 pandemic, which necessitated consideration that the patient might be at risk for infection with the SARS-CoV-2 virus that causes COVID-19. Institutional protocols and algorithms that pertain to the evaluation of patients at risk for COVID-19 are in a state of rapid change based on information released by regulatory bodies including the CDC and federal and state organizations. These policies and algorithms were followed during the patient's care in the ED.  Some ED evaluations and interventions may be delayed as a result of limited staffing during and the pandemic.*   Note:  This document was prepared using Dragon voice recognition  software and may include unintentional dictation errors.   Irean Hong, MD 12/09/20 [phone]

## 2020-12-08 NOTE — ED Notes (Signed)
Gray colored zip hoodie, black colored t-shirt, gray colored sweat pants, gray colored socks, burgandy colored shoes, 1 brown colored walled, 1 phone charger placed in labeled belonging bag to be secured on the unit.

## 2020-12-08 NOTE — ED Notes (Signed)
This RN went to clean another room located in the [place] and when RN came out of room pt was no longer sitting in hallway bed.

## 2020-12-08 NOTE — ED Triage Notes (Signed)
Pt states he wants help with detox from cocaine and etoh. Pt states he is not sure when he used cocaine last, and last drank etoh pta. Pt is falling asleep in triage. Pt denies SI or HI.

## 2020-12-08 NOTE — BH Assessment (Signed)
Referral information for Psychiatric Hospitalization faxed to;   . [facility] [phone])  . [facility] [phone])  . [facility] [phone] -or- [phone])  . [facility] [phone])   [facility] [phone])  . [facility] [phone])    [facility] [phone])

## 2020-12-08 NOTE — ED Triage Notes (Signed)
Patient reports seeking help with detox for cocaine and alcohol.  Patient reports that he has black out and he remembers being here earlier but does not remember being discharged or the outcome of that visit.  Patient states he would feel safer being here

## 2020-12-08 NOTE — ED Provider Notes (Signed)
[facility] Emergency Department Provider Note  ____________________________________________  Time seen: Approximately 5:40 AM  I have reviewed the triage vital signs and the nursing notes.   HISTORY  Chief Complaint Alcohol Problem   HPI Aaron Jensen is a 39 y.o. male with a history of depression, alcohol abuse, cocaine abuse who presents requesting detox.  Patient reports daily use of cocaine and multiple beers for several years.  Last cocaine alcohol use just prior to arrival.  No history of complicated withdrawals.  No SI or HI.  Patient does not take any medications at home for medical problems.  Denies any medical complaints at this time.  Patient is here requesting help with detox.   Past Medical History:  Diagnosis Date  . Depression     Patient Active Problem List   Diagnosis Date Noted  . Encounter to establish care 10/02/2020  . History of depression 10/02/2020  . Alcohol abuse with alcohol-induced disorder (HCC) 09/03/2019    No past surgical history on file.  Prior to Admission medications   Medication Sig Start Date End Date Taking? Authorizing Provider  folic acid (FOLVITE) 1 MG tablet Take 1 tablet (1 mg total) by mouth daily. Patient not taking: Reported on 11/22/2020 10/02/20   Iloabachie, Chioma E, NP    Allergies Cat hair extract  No family history on file.  Social History Social History   Tobacco Use  . Smoking status: Current Every Day Smoker  . Smokeless tobacco: Never Used  Substance Use Topics  . Alcohol use: Not Currently    Comment: last drink 7-8 months ago  . Drug use: Never    Review of Systems  Constitutional: Negative for fever. Eyes: Negative for visual changes. ENT: Negative for sore throat. Neck: No neck pain  Cardiovascular: Negative for chest pain. Respiratory: Negative for shortness of breath. Gastrointestinal: Negative for abdominal pain, vomiting or diarrhea. Genitourinary: Negative for  dysuria. Musculoskeletal: Negative for back pain. Skin: Negative for rash. Neurological: Negative for headaches, weakness or numbness. Psych: No SI or HI  ____________________________________________   PHYSICAL EXAM:  VITAL SIGNS: ED Triage Vitals [12/08/20 0343]  Enc Vitals Group     BP 105/78     Pulse Rate (!) 106     Resp 16     Temp 99.2 F (37.3 C)     Temp src      SpO2 96 %     Weight 150 lb (68 kg)     Height 6' (1.829 m)     Head Circumference      Peak Flow      Pain Score 0     Pain Loc      Pain Edu?      Excl. in GC?     Constitutional: Alert and oriented. Well appearing and in no apparent distress. HEENT:      Head: Normocephalic and atraumatic.         Eyes: Conjunctivae are normal. Sclera is non-icteric.       Mouth/Throat: Mucous membranes are moist.       Neck: Supple with no signs of meningismus. Cardiovascular: Regular rate and rhythm.  Respiratory: Normal respiratory effort.  Gastrointestinal: Soft, non tender, and non distended. Musculoskeletal: No edema, cyanosis, or erythema of extremities. Neurologic: Normal speech and language. Face is symmetric. Moving all extremities. No gross focal neurologic deficits are appreciated. Skin: Skin is warm, dry and intact. No rash noted. Psychiatric: Mood and affect are normal. Speech and behavior are  normal.  ____________________________________________   LABS (all labs ordered are listed, but only abnormal results are displayed)  Labs Reviewed  COMPREHENSIVE METABOLIC PANEL - Abnormal; Notable for the following components:      Result Value   CO2 20 (*)    Calcium 8.4 (*)    AST 46 (*)    All other components within normal limits  ETHANOL - Abnormal; Notable for the following components:   Alcohol, Ethyl (B) 195 (*)    All other components within normal limits  CBC   ____________________________________________  EKG  none  ____________________________________________  RADIOLOGY  none   ____________________________________________   PROCEDURES  Procedure(s) performed: None Procedures Critical Care performed:  None ____________________________________________   INITIAL IMPRESSION / ASSESSMENT AND PLAN / ED COURSE  39 y.o. male with a history of depression, alcohol abuse, cocaine abuse who presents requesting detox.  No history of complicated withdrawals.  Patient placed on CIWA protocol.  Labs for medical clearance showing no significant electrolyte derangements, alcohol level of 195.  UDS is pending.  Patient was evaluated by TTS we will try to find inpatient detox for him.  The patient has been placed in psychiatric observation due to the need to provide a safe environment for the patient while obtaining psychiatric consultation and evaluation, as well as ongoing medical and medication management to treat the patient's condition.  The patient has not been placed under full IVC at this time.  Patient's old medical records reviewed       Please note:  Patient was evaluated in Emergency Department today for the symptoms described in the history of present illness. Patient was evaluated in the context of the global COVID-19 pandemic, which necessitated consideration that the patient might be at risk for infection with the SARS-CoV-2 virus that causes COVID-19. Institutional protocols and algorithms that pertain to the evaluation of patients at risk for COVID-19 are in a state of rapid change based on information released by regulatory bodies including the CDC and federal and state organizations. These policies and algorithms were followed during the patient's care in the ED.  Some ED evaluations and interventions may be delayed as a result of limited staffing during the pandemic.   ____________________________________________   FINAL CLINICAL IMPRESSION(S) / ED DIAGNOSES   Final diagnoses:  Alcohol dependence with uncomplicated intoxication (HCC)  Cocaine abuse  (HCC)      NEW MEDICATIONS STARTED DURING THIS VISIT:  ED Discharge Orders    None       Note:  This document was prepared using Dragon voice recognition software and may include unintentional dictation errors.    Nita Sickle, MD 12/08/20 [phone]

## 2020-12-08 NOTE — BH Assessment (Signed)
Referral information for Psychiatric Hospitalization faxed to;    [facility] ([place]-[phone]), fax information to different fax number, she didn't have access to the other fax. Phone interview/Screening scheduled.    [facility] ([place]-[phone]), Willing to take patient, as long as he have home medications with him, if he take any.... However, patient left ER before writer was able to speak with him... Writer contacted [facility] and informed them he has left.   [facility] [phone] -or- [phone])   [facility] [phone]), Patient doesn't have any insurance. Will have to pay out of pocket cost.   [facility] [phone])   [facility] [phone]), Patient doesn't have any insurance. Will have to pay out of pocket cost.   [facility] (Mark-[phone]), No beds

## 2020-12-08 NOTE — ED Notes (Signed)
Pt requests ETOH detox at [facility]  Pt report last ETOH Friday night (10 x 40 oz [place]), pt c/o of "black outs" regardless of ETOH consumption that are characterized by loss of memory, pt reports regular use (last Friday night) of smoke and snorting cocaine  Pt c/o of 10/10 HA, 8/10 abdominal pain without N/V/D  Pt denies SI/HI/AH/VH

## 2020-12-08 NOTE — ED Notes (Signed)
This RN attempted to locate pt in bathrooms, ED waiting room, and outside in front of ED. RN unable to locate pt, MD and charge notified. No IV in place when pt left facility. Pt did not attempt to notify RN that he was leaving or had any intentions of leaving facility.

## 2020-12-09 LAB — URINE DRUG SCREEN, QUALITATIVE (ARMC ONLY)
Amphetamines, Ur Screen: NOT DETECTED
Barbiturates, Ur Screen: NOT DETECTED
Benzodiazepine, Ur Scrn: NOT DETECTED
Cannabinoid 50 Ng, Ur ~~LOC~~: NOT DETECTED
Cocaine Metabolite,Ur ~~LOC~~: POSITIVE — AB
MDMA (Ecstasy)Ur Screen: NOT DETECTED
Methadone Scn, Ur: NOT DETECTED
Opiate, Ur Screen: NOT DETECTED
Phencyclidine (PCP) Ur S: NOT DETECTED
Tricyclic, Ur Screen: NOT DETECTED

## 2020-12-09 NOTE — ED Notes (Signed)
Lunch meal tray given at this time.  

## 2020-12-09 NOTE — BH Assessment (Signed)
Assessment Note  Aaron Jensen is an 39 y.o. male. Per triage note: Patient reports seeking help with detox for cocaine and alcohol. Patient reports that he has black out and he remembers being here earlier but does not remember being discharged or the outcome of that visit.  Patient states he would feel safer being here  Pt presented with a disheveled appearance. Pt was alert and oriented x3 (not oriented to time). Pt's speech was normal, logical, and coherent. Motor behavior appears normal. Pt's thought content was relevant to the context of the situation. Eye contact was good. Pt had a decreased attention span. Pt's mood is sullen, affect is congruent with mood. Patient reports impaired memory in the context of blacking out episodes that cause him to not remember things. Pt eloped from the emergency department earlier in the morning. When probed, the pt reported that he must have blacked out because he was unable to recall leaving the hospital and any events throughout the day. Pt reported that he "came to" in the hotel parking lot near [facility], uncertain of how he'd gotten there. Pt was unable to recall whether he'd used substances today. The patient denied SI, HI, AV/hallucinations, or symptoms of paranoia. Pt expressed a desperation for treatment stating, "I don't want to drink myself to death". Patient was calm and cooperative throughout the interview.  Diagnosis: Alcohol use disorder, severe  Past Medical History:  Past Medical History:  Diagnosis Date  . Depression     No past surgical history on file.  Family History: No family history on file.  Social History:  reports that he has been smoking. He has never used smokeless tobacco. He reports previous alcohol use. He reports that he does not use drugs.  Additional Social History:  Alcohol / Drug Use Pain Medications: See PTA Prescriptions: See PTA History of alcohol / drug use?: Yes Negative Consequences of Use:  Financial,Personal relationships Withdrawal Symptoms: Blackouts Substance #1 Name of Substance 1: Alcohol 1 - Amount (size/oz): 10 40 oz beers 1 - Frequency: Daily 1 - Last Use / Amount: 12/07/20 Substance #2 Name of Substance 2: Cocaine  CIWA: CIWA-Ar BP: 107/82 Pulse Rate: (!) 103 COWS:    Allergies:  Allergies  Allergen Reactions  . Cat Hair Extract Hives    Home Medications: (Not in a hospital admission)   OB/GYN Status:  No LMP for male patient.  General Assessment Data Location of Assessment: [facility] ED TTS Assessment: In system Is this a Tele or Face-to-Face Assessment?: Face-to-Face Is this an Initial Assessment or a Re-assessment for this encounter?: Initial Assessment Patient Accompanied by:: N/A Language Other than English: No Living Arrangements: Homeless/Shelter What gender do you identify as?: Male Date Telepsych consult ordered in [facility]: 12/08/20 Time Telepsych consult ordered in [facility]: 2334 Marital status: Single Maiden name: n/a Pregnancy Status: No Living Arrangements: Other (Comment) Can pt return to current living arrangement?: Yes Admission Status: Voluntary Is patient capable of signing voluntary admission?: Yes Referral Source: Self/Family/Friend Insurance type: None  Medical Screening Exam [facility]) Medical Exam completed: Yes  Crisis Care Plan Living Arrangements: Other (Comment) Legal Guardian: Other: (Self) Name of Psychiatrist: None Name of Therapist: None  Education Status Is patient currently in school?: No Is the patient employed, unemployed or receiving disability?: Unemployed  Risk to self with the past 6 months Suicidal Ideation: No Has patient been a risk to self within the past 6 months prior to admission? : No Suicidal Intent: No Has patient had any  suicidal intent within the past 6 months prior to admission? : No Is patient at risk for suicide?: No Suicidal Plan?: No Has patient had any suicidal plan within the  past 6 months prior to admission? : No Access to Means: No What has been your use of drugs/alcohol within the last 12 months?: Alcohol; Cocaine Previous Attempts/Gestures: No How many times?: 0 Other Self Harm Risks: n/a Triggers for Past Attempts: Other (Comment) Intentional Self Injurious Behavior: None Family Suicide History: Unknown Recent stressful life event(s): Other (Comment) (Recently became homeless) Persecutory voices/beliefs?: No Depression: Yes Depression Symptoms: Feeling worthless/self pity Substance abuse history and/or treatment for substance abuse?: Yes Suicide prevention information given to non-admitted patients: Not applicable  Risk to Others within the past 6 months Homicidal Ideation: No Does patient have any lifetime risk of violence toward others beyond the six months prior to admission? : No Thoughts of Harm to Others: No Current Homicidal Intent: No Current Homicidal Plan: No Access to Homicidal Means: No Identified Victim: n/a History of harm to others?: No Assessment of Violence: None Noted Violent Behavior Description: n/a Does patient have access to weapons?: No Criminal Charges Pending?: No Does patient have a court date: No Is patient on probation?: No  Psychosis Hallucinations: None noted Delusions: None noted  Mental Status Report Appearance/Hygiene: Disheveled Eye Contact: Good Motor Activity: Freedom of movement Speech: Logical/coherent Level of Consciousness: Alert Mood: Despair Affect: Sad Anxiety Level: Minimal Thought Processes: Relevant,Coherent Judgement: Impaired Orientation: Person,Place,Time,Situation Obsessive Compulsive Thoughts/Behaviors: None  Cognitive Functioning Concentration: Normal Memory: Recent Impaired,Remote Intact Is patient IDD: No Insight: Fair Impulse Control: Poor Appetite: Poor Have you had any weight changes? : No Change Sleep: Decreased Total Hours of Sleep: 4 Vegetative Symptoms:  None  ADLScreening [facility] Assessment Services) Patient's cognitive ability adequate to safely complete daily activities?: Yes Patient able to express need for assistance with ADLs?: Yes Independently performs ADLs?: Yes (appropriate for developmental age)  Prior Inpatient Therapy Prior Inpatient Therapy: No  Prior Outpatient Therapy Prior Outpatient Therapy: Yes Prior Therapy Dates: Early December 2021 Prior Therapy Facilty/Provider(s): Unable to recall therapist Reason for Treatment: Substance Abuse Does patient have an ACCT team?: No Does patient have Intensive In-House Services?  : No Does patient have [facility] services? : No Does patient have P4CC services?: No  ADL Screening (condition at time of admission) Patient's cognitive ability adequate to safely complete daily activities?: Yes Is the patient deaf or have difficulty hearing?: No Does the patient have difficulty concentrating, remembering, or making decisions?: No Patient able to express need for assistance with ADLs?: Yes Does the patient have difficulty dressing or bathing?: No Independently performs ADLs?: Yes (appropriate for developmental age) Does the patient have difficulty walking or climbing stairs?: No Weakness of Legs: None Weakness of Arms/Hands: None  Home Assistive Devices/Equipment Home Assistive Devices/Equipment: None  Therapy Consults (therapy consults require a physician order) PT Evaluation Needed: No OT Evalulation Needed: No SLP Evaluation Needed: No Abuse/Neglect Assessment (Assessment to be complete while patient is alone) Abuse/Neglect Assessment Can Be Completed: Yes Physical Abuse: Denies Verbal Abuse: Denies Sexual Abuse: Denies Exploitation of patient/patient's resources: Denies Self-Neglect: Denies Values / Beliefs Cultural Requests During Hospitalization: None Spiritual Requests During Hospitalization: None Consults Spiritual Care Consult Needed: No Transition of Care Team  Consult Needed: No Advance Directives (For Healthcare) Does Patient Have a Medical Advance Directive?: No          Disposition: Pt to be referred to detox/substance abuse tx facilities.  Disposition Initial Assessment  Completed for this Encounter: Yes  On Site Evaluation by:   Reviewed with Physician:    Foy Guadalajara 12/09/2020 5:52 AM

## 2020-12-09 NOTE — ED Notes (Signed)
Pt ambulatory to the restroom with a steady gait at this time.  

## 2020-12-09 NOTE — BH Assessment (Signed)
Referral information for Psychiatric Hospitalization faxed to;   . [facility] [phone])  . [facility] [phone])  . [facility] [phone] -or- [phone])   [facility] [phone])   [facility](Mark-[phone])

## 2020-12-09 NOTE — ED Notes (Signed)
Breakfast meal tray given at this time.  

## 2020-12-09 NOTE — ED Notes (Signed)
Report received from [place], [place]. Pt currently sleeping at this time. RR equal and unlabored. Denies needs. ED stretcher locked and in lowest position.

## 2020-12-09 NOTE — ED Notes (Signed)
Dinner meal tray given at this time.  

## 2020-12-10 DIAGNOSIS — F1019 Alcohol abuse with unspecified alcohol-induced disorder: Secondary | ICD-10-CM

## 2020-12-10 NOTE — ED Notes (Signed)
Pt given breakfast tray

## 2020-12-10 NOTE — TOC Transition Note (Signed)
Transition of Care [facility]) - CM/SW Discharge Note   Patient Details  Name: Dowell Hoon MRN: [id number] Date of Birth: 12/30/1980  [facility]) CM/SW Contact:  Joseph Art, LCSWA Phone Number: 12/10/2020, 12:27 PM   Clinical Narrative:     Patient will d/c via [facility] to [facility] [address], [place] [address] [phone], ETA 1:00PM.  EDP/ED Staff notified.  TOC consult complete       Patient Goals and CMS Choice        Discharge Placement                       Discharge Plan and Services                                     Social Determinants of Health (SDOH) Interventions     Readmission Risk Interventions No flowsheet data found.

## 2020-12-10 NOTE — ED Provider Notes (Signed)
-----------------------------------------   12:29 PM on 12/10/2020 -----------------------------------------  Patient cleared by psychiatry and does not represent a threat to himself or others.  He was accepted for detox at [facility] and will be appropriate for discharge with transport provided.   Chesley Noon, MD 12/10/20 1230

## 2020-12-10 NOTE — ED Provider Notes (Signed)
Emergency Medicine Observation Re-evaluation Note  Aaron Jensen is a 39 y.o. male, seen on rounds today.  Pt initially presented to the ED for complaints of Detox Currently, the patient is calm with no complaints.  Physical Exam  BP 117/81   Pulse 89   Temp 98.6 F (37 C) (Oral)   Resp 12   Ht 6' (1.829 m)   Wt 68 kg   SpO2 97%   BMI 20.33 kg/m  Physical Exam General: No apparent distress HEENT: moist mucous membranes CV: RRR Pulm: Normal WOB GI: soft and non tender MSK: no edema or cyanosis Neuro: face symmetric, moving all extremities    ED Course / MDM  I have reviewed the labs performed to date as well as medications administered while in observation.  No acute changes overnight or new labs this morning.  Plan  Current plan is for placement.    Nita Sickle, MD 12/10/20 [phone]

## 2020-12-10 NOTE — BH Assessment (Signed)
TTS met with patient for reassessment. Patient reports he is feeling much better but a little tired. Patient denies SI/HI. Patient reports he is homeless with no family and wants detox for alcohol. Patient was calm and cooperative throughout the interview. TTS will continue to check referrals.

## 2020-12-10 NOTE — ED Notes (Signed)
Vs assessed and shower offered.  

## 2020-12-10 NOTE — ED Notes (Signed)
Report to dee, rn.  

## 2020-12-10 NOTE — BH Assessment (Signed)
Per patient preference, TTS contacted [facility] [phone] and spoke with the Manager, Wyn Forster who stated patient is able to come as long as he is able to complete 40hrs of work and must arrive before 9pm tonight. TTS reached out to ER, Social Work & ED Sec for transportation needs.

## 2020-12-10 NOTE — ED Notes (Signed)
Pt transported by [employer] [facility].

## 2020-12-10 NOTE — BH Assessment (Signed)
Patient has been accepted to [facility].   Call report to [phone].  Representative was [facility].   ER Staff is aware of it:  [place], ER Secretary  Dr. Larinda Buttery, ER MD  Geraldine Contras, Patient's Nurse     [facility] [address], [place] [address] [phone]

## 2020-12-10 NOTE — Consult Note (Signed)
[facility]   Reason for Consult: Consult for this 39 year old man with alcohol abuse Referring Physician: Su HoffJesup Patient Identification: Barbie BannerWilliam Ramaswamy MRN:  [id number] Principal Diagnosis: Alcohol abuse with alcohol-induced disorder [facility](HCC) Diagnosis:  Principal Problem:   Alcohol abuse with alcohol-induced disorder (HCC)   Total Time spent with patient: 1 hour  Subjective:   Barbie BannerWilliam Wanless is a 39 y.o. male patient admitted with "I need some help to stop drinking".  HPI: Patient seen chart reviewed.  39-year-old man came to the emergency room intoxicated than left had a blackout has little memory of what happened in between came back to the emergency room.  Patient says that he has been out there drinking as much as he can for many days over a week.  He had been staying in the local rescue mission but left there and relapsed.  Many losses recently lost a job lost his sobriety.  He had some passive suicidal thoughts but denies any current suicidal intent or plan.  Denies any hallucinations or psychotic symptoms.  No aggressive thought.  He also admits to some recent cocaine use.  He is specifically requesting to go back to a rescue mission type environment  Past Psychiatric History: No previous psychiatric medicine or psychiatric admission.  Outpatient and residential substance abuse treatment.  Longest sobriety about 6 months.  No history of suicide attempts  Risk to Self: Suicidal Ideation: No Suicidal Intent: No Is patient at risk for suicide?: No Suicidal Plan?: No Access to Means: No What has been your use of drugs/alcohol within the last 12 months?: Alcohol; Cocaine How many times?: 0 Other Self Harm Risks: n/a Triggers for Past Attempts: Other (Comment) Intentional Self Injurious Behavior: None Risk to Others: Homicidal Ideation: No Thoughts of Harm to Others: No Current Homicidal Intent: No Current Homicidal Plan: No Access to Homicidal Means:  No Identified Victim: n/a History of harm to others?: No Assessment of Violence: None Noted Violent Behavior Description: n/a Does patient have access to weapons?: No Criminal Charges Pending?: No Does patient have a court date: No Prior Inpatient Therapy: Prior Inpatient Therapy: No Prior Outpatient Therapy: Prior Outpatient Therapy: Yes Prior Therapy Dates: Early December 2021 Prior Therapy Facilty/Provider(s): Unable to recall therapist Reason for Treatment: Substance Abuse Does patient have an ACCT team?: No Does patient have Intensive In-House Services?  : No Does patient have [facility] services? : No Does patient have P4CC services?: No  Past Medical History:  Past Medical History:  Diagnosis Date  . Depression    No past surgical history on file. Family History: No family history on file. Family Psychiatric  History: See previous Social History:  Social History   Substance and Sexual Activity  Alcohol Use Not Currently   Comment: last drink 7-8 months ago     Social History   Substance and Sexual Activity  Drug Use Never    Social History   Socioeconomic History  . Marital status: Single    Spouse name: Not on file  . Number of children: Not on file  . Years of education: Not on file  . Highest education level: Not on file  Occupational History  . Not on file  Tobacco Use  . Smoking status: Current Every Day Smoker  . Smokeless tobacco: Never Used  Substance and Sexual Activity  . Alcohol use: Not Currently    Comment: last drink 7-8 months ago  . Drug use: Never  . Sexual activity: Not on file  Other Topics Concern  .  Not on file  Social History Narrative  . Not on file   Social Determinants of Health   Financial Resource Strain: Not on file  Food Insecurity: Not on file  Transportation Needs: Not on file  Physical Activity: Not on file  Stress: Not on file  Social Connections: Not on file   Additional Social History:    Allergies:    Allergies  Allergen Reactions  . Cat Hair Extract Hives    Labs:  Results for orders placed or performed during the hospital encounter of 12/08/20 (from the past 48 hour(s))  Comprehensive metabolic panel     Status: Abnormal   Collection Time: 12/08/20 10:25 PM  Result Value Ref Range   Sodium 139 135 - 145 mmol/L   Potassium 4.0 3.5 - 5.1 mmol/L   Chloride 106 98 - 111 mmol/L   CO2 21 (L) 22 - 32 mmol/L   Glucose, Bld 87 70 - 99 mg/dL    Comment: Glucose reference range applies only to samples taken after fasting for at least 8 hours.   BUN 13 6 - 20 mg/dL   Creatinine, Ser 5.32 0.61 - 1.24 mg/dL   Calcium 8.1 (L) 8.9 - 10.3 mg/dL   Total Protein 7.2 6.5 - 8.1 g/dL   Albumin 4.1 3.5 - 5.0 g/dL   AST 42 (H) 15 - 41 U/L   ALT 39 0 - 44 U/L   Alkaline Phosphatase 69 38 - 126 U/L   Total Bilirubin 0.6 0.3 - 1.2 mg/dL   GFR, Estimated >99 >24 mL/min    Comment: (NOTE) Calculated using the CKD-EPI Creatinine Equation (2021)    Anion gap 12 5 - 15    Comment: Performed at [facility], [address] [place], [place] [address]  Ethanol     Status: Abnormal   Collection Time: 12/08/20 10:25 PM  Result Value Ref Range   Alcohol, Ethyl (B) 189 (H) <10 mg/dL    Comment: (NOTE) Lowest detectable limit for serum alcohol is 10 mg/dL.  For medical purposes only. Performed at [facility], [address] [place], [place] [address]   Salicylate level     Status: Abnormal   Collection Time: 12/08/20 10:25 PM  Result Value Ref Range   Salicylate Lvl <7.0 (L) 7.0 - 30.0 mg/dL    Comment: Performed at [facility], [address] [place], [place] [address]  Acetaminophen level     Status: Abnormal   Collection Time: 12/08/20 10:25 PM  Result Value Ref Range   Acetaminophen (Tylenol), Serum <10 (L) 10 - 30 ug/mL    Comment: (NOTE) Therapeutic concentrations vary significantly. A range of 10-30 ug/mL  may be an effective concentration for many  patients. However, some  are best treated at concentrations outside of this range. Acetaminophen concentrations >150 ug/mL at 4 hours after ingestion  and >50 ug/mL at 12 hours after ingestion are often associated with  toxic reactions.  Performed at [facility], [address] [place], [place] [address]   cbc     Status: None   Collection Time: 12/08/20 10:25 PM  Result Value Ref Range   WBC 5.6 4.0 - 10.5 K/uL   RBC 4.47 4.22 - 5.81 MIL/uL   Hemoglobin 15.0 13.0 - 17.0 g/dL   HCT 41.7 40.8 - 14.4 %   MCV 94.2 80.0 - 100.0 fL   MCH 33.6 26.0 - 34.0 pg   MCHC 35.6 30.0 - 36.0 g/dL   RDW 81.8 56.3 -  15.5 %   Platelets 171 150 - 400 K/uL   nRBC 0.0 0.0 - 0.2 %    Comment: Performed at [facility], [address] [place], [place] [address]  Urine Drug Screen, Qualitative     Status: Abnormal   Collection Time: 12/08/20 11:40 PM  Result Value Ref Range   Tricyclic, Ur Screen NONE DETECTED NONE DETECTED   Amphetamines, Ur Screen NONE DETECTED NONE DETECTED   MDMA (Ecstasy)Ur Screen NONE DETECTED NONE DETECTED   Cocaine Metabolite,Ur [place] POSITIVE (A) NONE DETECTED   Opiate, Ur Screen NONE DETECTED NONE DETECTED   Phencyclidine (PCP) Ur S NONE DETECTED NONE DETECTED   Cannabinoid 50 Ng, Ur [place] NONE DETECTED NONE DETECTED   Barbiturates, Ur Screen NONE DETECTED NONE DETECTED   Benzodiazepine, Ur Scrn NONE DETECTED NONE DETECTED   Methadone Scn, Ur NONE DETECTED NONE DETECTED    Comment: (NOTE) Tricyclics + metabolites, urine    Cutoff 1000 ng/mL Amphetamines + metabolites, urine  Cutoff 1000 ng/mL MDMA (Ecstasy), urine              Cutoff 500 ng/mL Cocaine Metabolite, urine          Cutoff 300 ng/mL Opiate + metabolites, urine        Cutoff 300 ng/mL Phencyclidine (PCP), urine         Cutoff 25 ng/mL Cannabinoid, urine                 Cutoff 50 ng/mL Barbiturates + metabolites, urine  Cutoff 200 ng/mL Benzodiazepine, urine              Cutoff 200  ng/mL Methadone, urine                   Cutoff 300 ng/mL  The urine drug screen provides only a preliminary, unconfirmed analytical test result and should not be used for non-medical purposes. Clinical consideration and professional judgment should be applied to any positive drug screen result due to possible interfering substances. A more specific alternate chemical method must be used in order to obtain a confirmed analytical result. Gas chromatography / mass spectrometry (GC/MS) is the preferred confirm atory method. Performed at [facility], [address], [place], [place] [address]     Current Facility-Administered Medications  Medication Dose Route Frequency Provider Last Rate Last Admin  . LORazepam (ATIVAN) tablet 0-4 mg  0-4 mg Oral Q6H Irean Hong, MD   1 mg at 12/09/20 1756  . thiamine tablet 100 mg  100 mg Oral Daily Irean Hong, MD   100 mg at 12/10/20 2694   Current Outpatient Medications  Medication Sig Dispense Refill  . folic acid (FOLVITE) 1 MG tablet Take 1 tablet (1 mg total) by mouth daily. (Patient not taking: No sig reported) 30 tablet 0    Musculoskeletal: Strength & Muscle Tone: within normal limits Gait & Station: normal Patient leans: N/A  Psychiatric Specialty Exam: Physical Exam Vitals and nursing note reviewed.  Constitutional:      Appearance: He is well-developed and well-nourished.  HENT:     Head: Normocephalic and atraumatic.  Eyes:     Conjunctiva/sclera: Conjunctivae normal.     Pupils: Pupils are equal, round, and reactive to light.  Cardiovascular:     Heart sounds: Normal heart sounds.  Pulmonary:     Effort: Pulmonary effort is normal.  Abdominal:     Palpations: Abdomen is soft.  Musculoskeletal:  General: Normal range of motion.     Cervical back: Normal range of motion.  Skin:    General: Skin is warm and dry.  Neurological:     General: No focal deficit present.     Mental Status: He is alert.   Psychiatric:        Attention and Perception: Attention normal.        Mood and Affect: Affect is blunt.        Speech: Speech normal.        Behavior: Behavior is cooperative.        Thought Content: Thought content normal.        Cognition and Memory: Cognition normal.        Judgment: Judgment normal.     Review of Systems  Constitutional: Negative.   HENT: Negative.   Eyes: Negative.   Respiratory: Negative.   Cardiovascular: Negative.   Gastrointestinal: Negative.   Musculoskeletal: Negative.   Skin: Negative.   Neurological: Negative.   Psychiatric/Behavioral: Positive for dysphoric mood. Negative for hallucinations, self-injury, sleep disturbance and suicidal ideas. The patient is not nervous/anxious and is not hyperactive.     Blood pressure 120/77, pulse 62, temperature 98.3 F (36.8 C), temperature source Oral, resp. rate 16, height 6' (1.829 m), weight 68 kg, SpO2 96 %.Body mass index is 20.33 kg/m.  General Appearance: Casual  Eye Contact:  Fair  Speech:  Slow  Volume:  Decreased  Mood:  Euthymic  Affect:  Congruent  Thought Process:  Coherent  Orientation:  Full (Time, Place, and Person)  Thought Content:  Logical  Suicidal Thoughts:  No  Homicidal Thoughts:  No  Memory:  Immediate;   Fair Recent;   Fair Remote;   Fair  Judgement:  Fair  Insight:  Fair  Psychomotor Activity:  Normal  Concentration:  Concentration: Fair  Recall:  [employer] of Knowledge:  Fair  Language:  Fair  Akathisia:  No  Handed:  Right  AIMS (if indicated):     Assets:  Desire for Improvement  ADL's:  Intact  Cognition:  WNL  Sleep:        Treatment Plan Summary: Plan Patient currently denies suicidal ideation.  There is no evidence of psychosis.  He is cooperative and pleasant.  I have spoken with TTS and recommended that we try referring him to local rescue missions.  Apparently the [facility] rescue [facility] would be open to having him if he could get there by this evening.   We will work with his transition of care to see if there can be arrangements for transfer.  No indication for any psychiatric hospitalization no indication for any medication at this point.  Disposition: Patient does not meet criteria for psychiatric inpatient admission. Supportive therapy provided about ongoing stressors.  Mordecai Rasmussen, MD 12/10/2020 11:35 AM

## 2020-12-28 ENCOUNTER — Encounter: Payer: Self-pay | Admitting: Diagnostic Neuroimaging

## 2020-12-28 ENCOUNTER — Ambulatory Visit: Payer: Medicaid Other | Admitting: Diagnostic Neuroimaging

## 2021-02-07 ENCOUNTER — Telehealth: Payer: Self-pay

## 2021-02-07 ENCOUNTER — Telehealth: Payer: Self-pay | Admitting: Gerontology

## 2021-02-07 NOTE — Telephone Encounter (Signed)
Called pt @5  pm on 2/17 but line was busy/unavailable-KW

## 2021-02-07 NOTE — Telephone Encounter (Signed)
Tried to schedule in-clinin fu appt, no response

## 2021-02-13 ENCOUNTER — Telehealth: Payer: Self-pay | Admitting: Pharmacy Technician

## 2021-02-13 NOTE — Telephone Encounter (Signed)
Spoke with Henderson Baltimore at [facility].  He verified that patient is no longer at [facility].  Attempted to contact patient via telephone.  Unable to reach patient.  Continued to receive a busy signal.  Patient needs to provide financial documentation for 2022.  [facility] unable to provide additional medication until requested documentation is received.  Sherilyn Dacosta Care Manager [facility]

## 2021-02-14 ENCOUNTER — Telehealth: Payer: Self-pay | Admitting: Gerontology

## 2021-02-14 NOTE — Telephone Encounter (Signed)
Called pt twice and got a busy tone each time.   Aaron Jensen 02/14/21 @ 2:55

## 2021-02-14 NOTE — Telephone Encounter (Signed)
Called pt @5  pm on 2/24 but line was busy/unavailable. Unable to LVM-KW

## 2021-02-26 ENCOUNTER — Telehealth: Payer: Self-pay

## 2021-02-26 NOTE — Telephone Encounter (Signed)
Unable to reach to reschedule appt.

## 2021-07-23 ENCOUNTER — Telehealth: Payer: Self-pay

## 2021-07-23 NOTE — Telephone Encounter (Signed)
Social worker called patient to follow up on his progress with reaching out to [facility] (last year he was referred) but the phone just rang

## 2021-10-21 IMAGING — CT CT HEAD W/O CM
3 series · 15 of 47 positions shown, 18 images · non-contrast
Comparison: 02/09/2019

CLINICAL DATA: Syncope.

EXAM:
CT HEAD WITHOUT CONTRAST
TECHNIQUE: Contiguous axial images were obtained from the base of the skull
through the vertex without intravenous contrast.

[Series 3: head wo · axial · 0.43mm/px · z∈[+490,+615]mm · 9 of 31 slices shown, 12 images]
[im 3/31  brain]
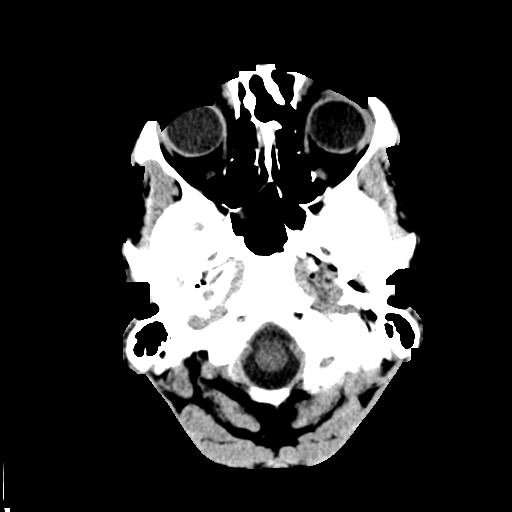
[im 3/31  bone]
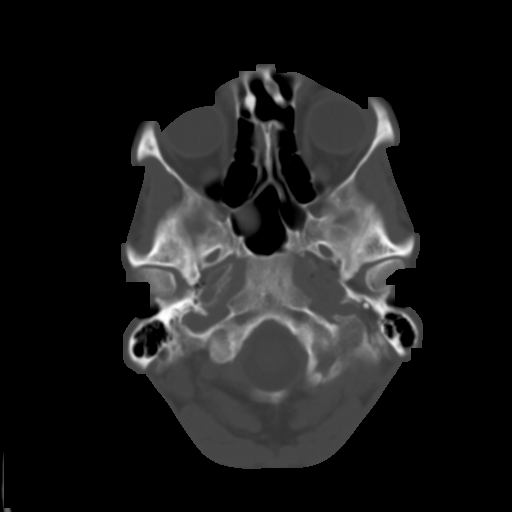
[im 6/31  brain]
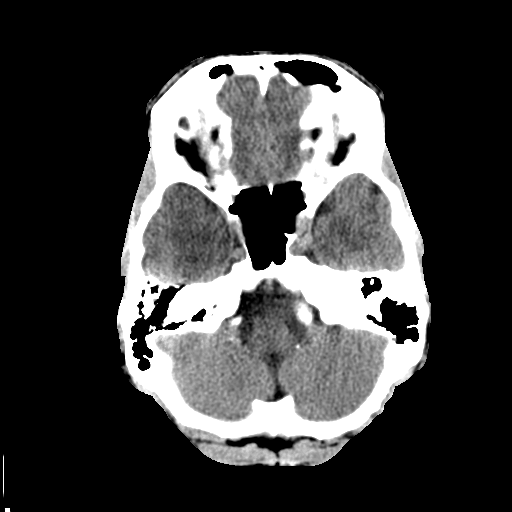
[im 9/31  brain]
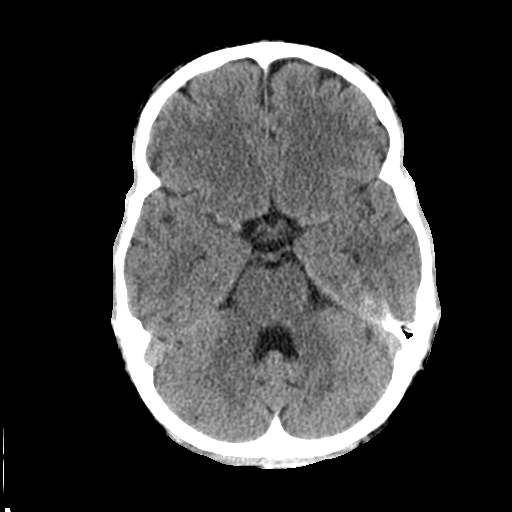
[im 12/31  brain]
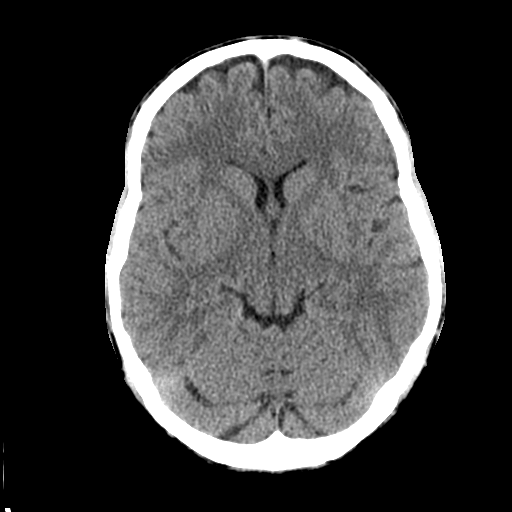
[im 16/31  brain]
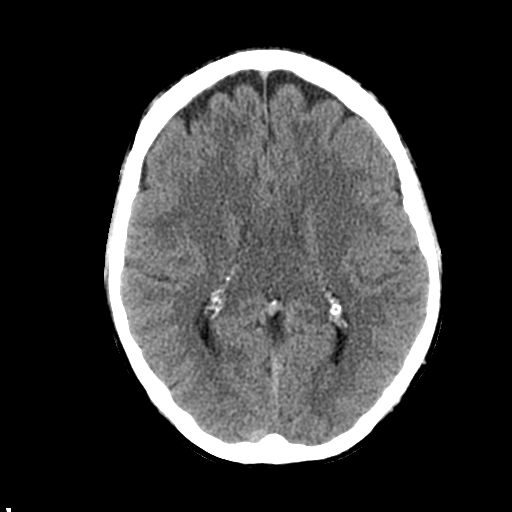
[im 16/31  bone]
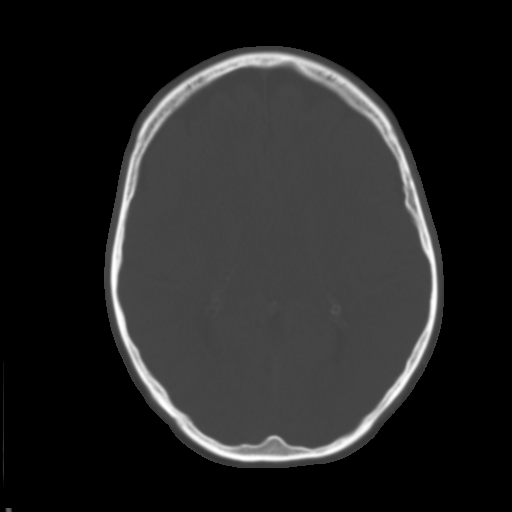
[im 19/31  brain]
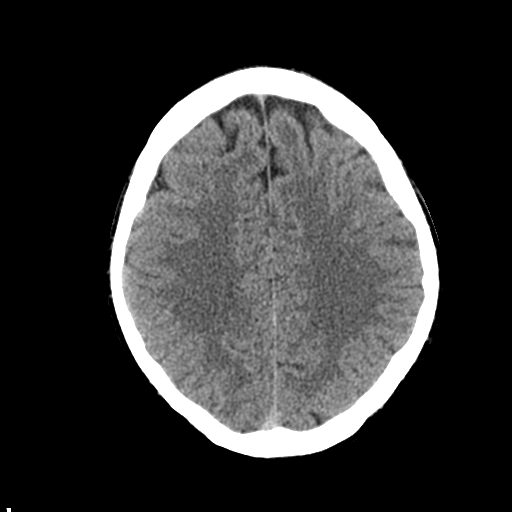
[im 22/31  brain]
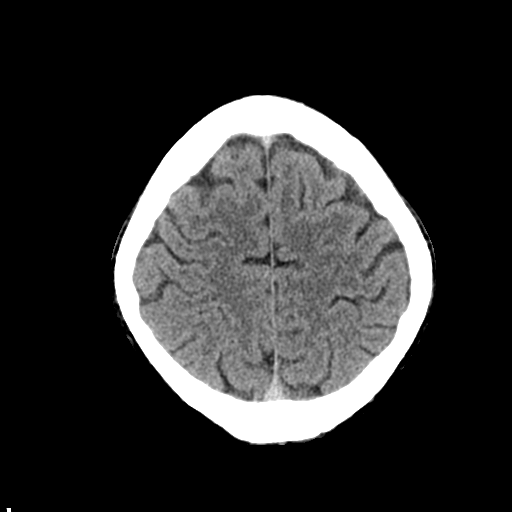
[im 25/31  brain]
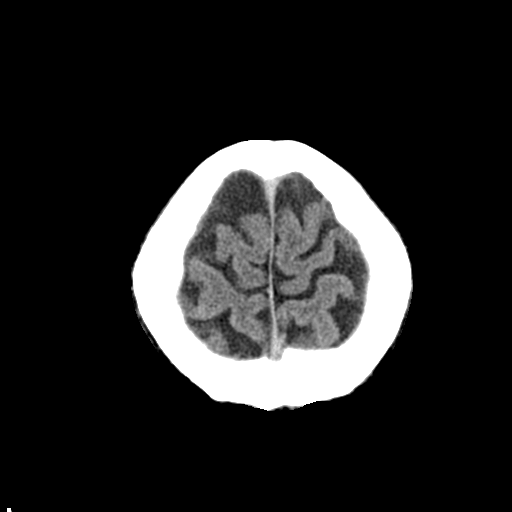
[im 28/31  brain]
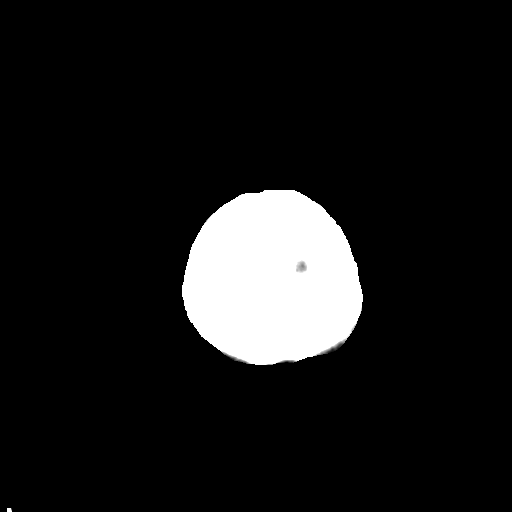
[im 28/31  bone]
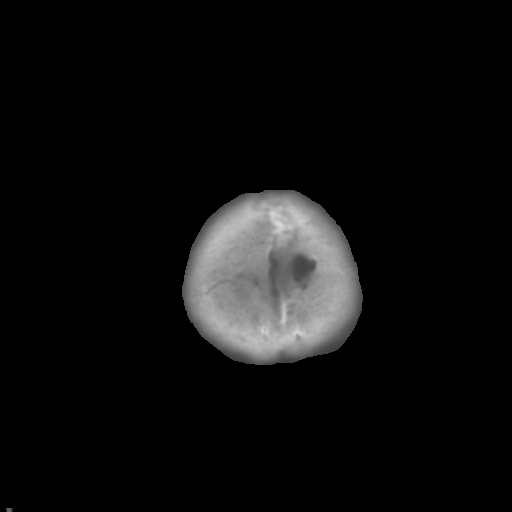

[Series 5: coronal soft tissue · coronal · 0.31mm/px · 3 of 62 slices shown]
[im 21/62  brain]
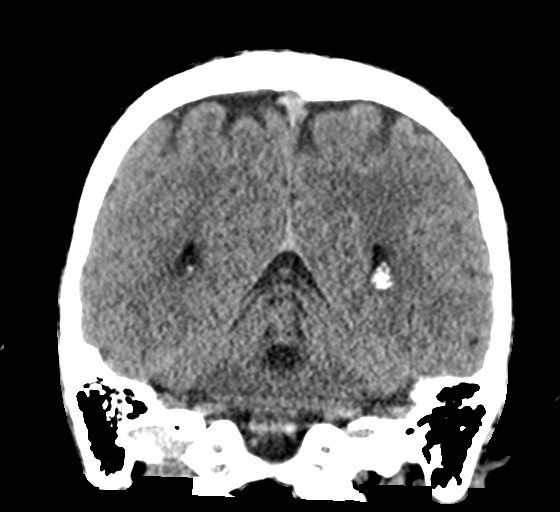
[im 28/62  brain]
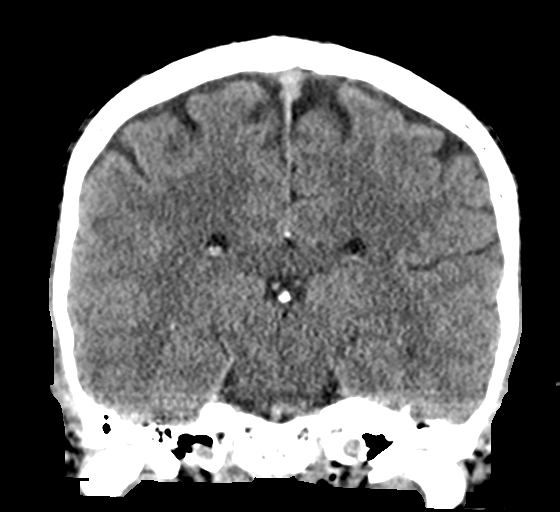
[im 34/62  brain]
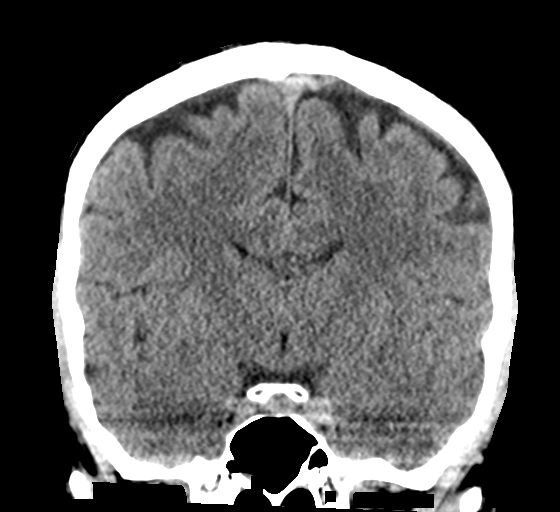

[Series 6: sagittal soft tissue · sagittal · 0.31mm/px · 3 of 51 slices shown]
[im 17/51  brain]
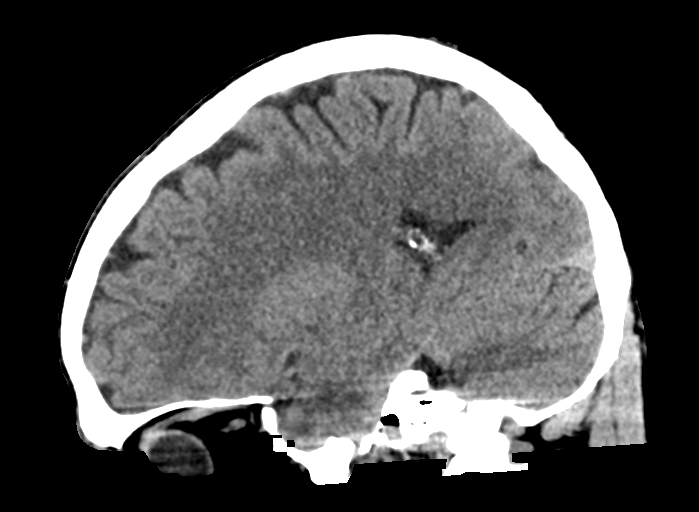
[im 26/51  brain]
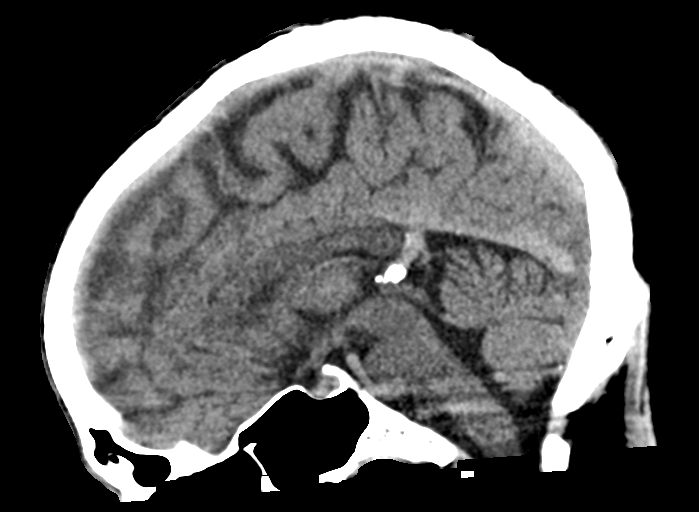
[im 34/51  brain]
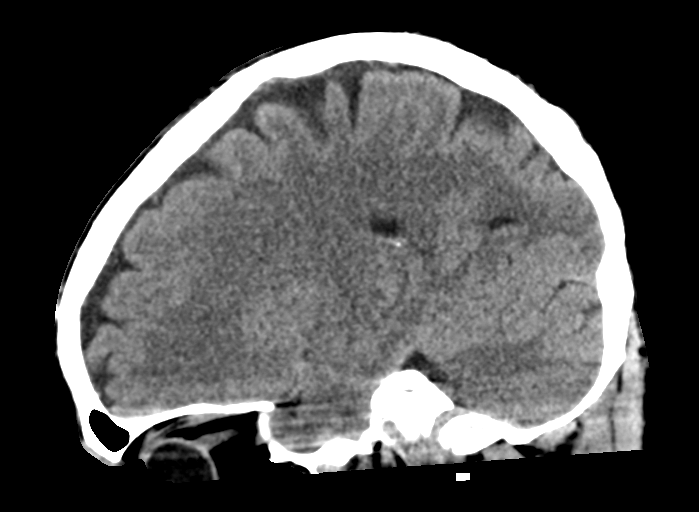

[15 of 47 positions shown; findings below may reference images not displayed]

FINDINGS: Brain: There is no evidence for acute hemorrhage, hydrocephalus,
mass lesion, or abnormal extra-axial fluid collection. No definite
CT evidence for acute infarction.

Vascular: No hyperdense vessel or unexpected calcification.

Skull: No evidence for fracture. No worrisome lytic or sclerotic
lesion.

Sinuses/Orbits: Polypoid mucosal disease in the right sphenoid sinus
is stable. Visualized portions of the globes and intraorbital fat
are unremarkable.

Other: None.
IMPRESSION: Stable.  Unremarkable CT appearance of the brain.

## 2023-08-04 ENCOUNTER — Other Ambulatory Visit: Payer: Self-pay
